# Patient Record
Sex: Female | Born: 1955 | Hispanic: Yes | Marital: Married | Smoking: Never smoker
Health system: Southern US, Community
[De-identification: ages and names within clinical notes are randomized; demographics above are authoritative.]

## PROBLEM LIST (undated history)

## (undated) DIAGNOSIS — I219 Acute myocardial infarction, unspecified: Secondary | ICD-10-CM

## (undated) DIAGNOSIS — E7439 Other disorders of intestinal carbohydrate absorption: Secondary | ICD-10-CM

## (undated) DIAGNOSIS — I10 Essential (primary) hypertension: Secondary | ICD-10-CM

---

## 2017-03-17 ENCOUNTER — Encounter (HOSPITAL_COMMUNITY): Payer: Self-pay

## 2017-03-17 ENCOUNTER — Emergency Department (HOSPITAL_COMMUNITY): Payer: Self-pay

## 2017-03-17 ENCOUNTER — Inpatient Hospital Stay (HOSPITAL_COMMUNITY)
Admission: EM | Admit: 2017-03-17 | Discharge: 2017-03-21 | DRG: 481 | Disposition: A | Discharge status: Home / Self Care | Payer: Self-pay | Attending: Internal Medicine | Admitting: Internal Medicine

## 2017-03-17 DIAGNOSIS — D72829 Elevated white blood cell count, unspecified: Secondary | ICD-10-CM | POA: Diagnosis present

## 2017-03-17 DIAGNOSIS — D696 Thrombocytopenia, unspecified: Secondary | ICD-10-CM | POA: Diagnosis present

## 2017-03-17 DIAGNOSIS — W109XXA Fall (on) (from) unspecified stairs and steps, initial encounter: Secondary | ICD-10-CM | POA: Diagnosis present

## 2017-03-17 DIAGNOSIS — D62 Acute posthemorrhagic anemia: Secondary | ICD-10-CM | POA: Diagnosis not present

## 2017-03-17 DIAGNOSIS — E7439 Other disorders of intestinal carbohydrate absorption: Secondary | ICD-10-CM | POA: Diagnosis present

## 2017-03-17 DIAGNOSIS — Z9889 Other specified postprocedural states: Secondary | ICD-10-CM

## 2017-03-17 DIAGNOSIS — F329 Major depressive disorder, single episode, unspecified: Secondary | ICD-10-CM | POA: Diagnosis present

## 2017-03-17 DIAGNOSIS — I2510 Atherosclerotic heart disease of native coronary artery without angina pectoris: Secondary | ICD-10-CM | POA: Diagnosis present

## 2017-03-17 DIAGNOSIS — S72492A Other fracture of lower end of left femur, initial encounter for closed fracture: Secondary | ICD-10-CM

## 2017-03-17 DIAGNOSIS — I10 Essential (primary) hypertension: Secondary | ICD-10-CM | POA: Diagnosis present

## 2017-03-17 DIAGNOSIS — I252 Old myocardial infarction: Secondary | ICD-10-CM

## 2017-03-17 DIAGNOSIS — S72332A Displaced oblique fracture of shaft of left femur, initial encounter for closed fracture: Principal | ICD-10-CM | POA: Diagnosis present

## 2017-03-17 DIAGNOSIS — R739 Hyperglycemia, unspecified: Secondary | ICD-10-CM | POA: Diagnosis present

## 2017-03-17 DIAGNOSIS — Z419 Encounter for procedure for purposes other than remedying health state, unspecified: Secondary | ICD-10-CM

## 2017-03-17 DIAGNOSIS — R52 Pain, unspecified: Secondary | ICD-10-CM

## 2017-03-17 HISTORY — DX: Other disorders of intestinal carbohydrate absorption: E74.39

## 2017-03-17 HISTORY — DX: Acute myocardial infarction, unspecified: I21.9

## 2017-03-17 HISTORY — DX: Essential (primary) hypertension: I10

## 2017-03-17 MED ORDER — OXYCODONE-ACETAMINOPHEN 5-325 MG PO TABS
2.0000 | ORAL_TABLET | Freq: Once | ORAL | Status: AC
  Administered 2017-03-18: 2 via ORAL
  Filled 2017-03-17: qty 2

## 2017-03-17 NOTE — ED Triage Notes (Signed)
Pt comes via [facility] for fall, slipped down appx 3-4 stairs, L knee got caught under her, pedal pulses weak. No LOC. PTA received 150 mcg of fentanyl

## 2017-03-17 NOTE — ED Provider Notes (Signed)
[facility]-EMERGENCY DEPT Provider Note   CSN: [id number] Arrival date & time: 03/17/17  2258     History   Chief Complaint Chief Complaint  Patient presents with  . Fall    HPI Michele Petersen is a 61 y.o. female.  Patient presents via EMS after a fall down 3 or 4 stairs. History through translator at bedside. Patient states she lost her balance and slipped and fell down about 3 stairs with her left lower leg being pinned underneath her. Did not hit head or lose consciousness. Denies head, neck or back pain. Denies chest pain or abdominal pain. Complains of pain to left knee and left lower leg. No focal weakness, numbness or tingling. No bowel or bladder incontinence. She does not take any blood thinners. Denies any other injury. Remembers whole fall. Denies any preceding dizziness or lightheadedness.   The history is provided by the patient and the EMS personnel. The history is limited by a language barrier.  Fall  Pertinent negatives include no chest pain, no abdominal pain, no headaches and no shortness of breath.    Past Medical History:  Diagnosis Date  . Hypertension     There are no active problems to display for this patient.   Past Surgical History:  Procedure Laterality Date  . CESAREAN SECTION      OB History    No data available       Home Medications    Prior to Admission medications   Not on File    Family History No family history on file.  Social History Social History  Substance Use Topics  . Smoking status: Never Smoker  . Smokeless tobacco: Never Used  . Alcohol use No     Allergies   Patient has no known allergies.   Review of Systems Review of Systems  Constitutional: Negative for activity change, appetite change and fever.  HENT: Negative for congestion, nosebleeds and postnasal drip.   Respiratory: Negative for cough, chest tightness and shortness of breath.   Cardiovascular: Negative for chest pain.  Gastrointestinal: Negative  for abdominal pain, nausea and vomiting.  Genitourinary: Negative for dysuria and hematuria.  Musculoskeletal: Positive for arthralgias and myalgias. Negative for back pain and neck pain.  Skin: Negative for rash.  Neurological: Negative for dizziness, syncope, light-headedness and headaches.    all other systems are negative except as noted in the HPI and PMH.    Physical Exam Updated Vital Signs BP (!) 194/92 (BP Location: Left Arm)   Pulse 65   Resp 20   Ht  (1.626 m)   Wt 98 kg (216 lb)   SpO2 98%   BMI 37.08 kg/m   Physical Exam  Constitutional: She is oriented to person, place, and time. She appears well-developed and well-nourished. No distress.  HENT:  Head: Normocephalic and atraumatic.  Mouth/Throat: Oropharynx is clear and moist. No oropharyngeal exudate.  Eyes: Pupils are equal, round, and reactive to light. Conjunctivae and EOM are normal.  Neck: Normal range of motion. Neck supple.  No C spine tenderness  Cardiovascular: Normal rate, regular rhythm, normal heart sounds and intact distal pulses.   No murmur heard. Pulmonary/Chest: Effort normal and breath sounds normal. No respiratory distress.  Abdominal: Soft. There is no tenderness. There is no rebound and no guarding.  Musculoskeletal: Normal range of motion. She exhibits edema, tenderness and deformity.  Mild diffuse swelling of L knee, distal femur and proximal lower leg. Reduced ROM due to pain.  Intact DP and  PT pulses. Ankle flexion and extension intact. Compartments soft.   Neurological: She is alert and oriented to person, place, and time. No cranial nerve deficit. She exhibits normal muscle tone. Coordination normal.   5/5 strength throughout. CN 2-12 intact.Equal grip strength.   Skin: Skin is warm.  Psychiatric: She has a normal mood and affect. Her behavior is normal.  Nursing note and vitals reviewed.    ED Treatments / Results  Labs (all labs ordered are listed, but only abnormal results  are displayed) Labs Reviewed  CBC WITH DIFFERENTIAL/PLATELET - Abnormal; Notable for the following:       Result Value   Hemoglobin 11.9 (*)    Platelets 136 (*)    All other components within normal limits  BASIC METABOLIC PANEL - Abnormal; Notable for the following:    Glucose, Bld 148 (*)    Creatinine, Ser 1.11 (*)    Calcium 8.8 (*)    GFR calc non Af Amer 53 (*)    All other components within normal limits  HEPATIC FUNCTION PANEL - Abnormal; Notable for the following:    Total Protein 6.4 (*)    All other components within normal limits  SURGICAL PCR SCREEN  PROTIME-INR  APTT  HEMOGLOBIN A1C  HIV ANTIBODY (ROUTINE TESTING)    EKG  EKG Interpretation None       Radiology Dg Knee 1-2 Views Left  Result Date: 03/18/2017 CLINICAL DATA:  61 y/o  F; status post fall. EXAM: LEFT FEMUR 2 VIEWS; DG HIP (WITH OR WITHOUT PELVIS) 2-3V LEFT; LEFT KNEE - 1-2 VIEW; LEFT TIBIA AND FIBULA - 2 VIEW COMPARISON:  None. FINDINGS: Left hip: There is no evidence of fracture or other focal bone lesions. Soft tissues are unremarkable. Left femur: Acute comminuted oblique fracture of the lower femoral diaphysis with 3 major fracture components. There is 1 shaft's medial displacement of the proximal femur relative to the distal femur. Left knee: Comminuted lower femur fracture as described above. The knee joint is well aligned with mild medial femorotibial compartment joint space narrowing and small periarticular osteophytes. Left tibia and fibula: There is no evidence of fracture or other focal bone lesions. Soft tissues are unremarkable. Large plantar calcaneal enthesophyte. IMPRESSION: Acute comminuted oblique fracture of the lower femoral diaphysis with 3 major fracture components. One shaft's with medial displacement of proximal femur relative to distal femur. Hip, knee, and ankle joints are maintained. Electronically Signed   By: Mitzi Hansen M.D.   On: 03/18/2017 00:27   Dg  Tibia/fibula Left  Result Date: 03/18/2017 CLINICAL DATA:  61 y/o  F; status post fall. EXAM: LEFT FEMUR 2 VIEWS; DG HIP (WITH OR WITHOUT PELVIS) 2-3V LEFT; LEFT KNEE - 1-2 VIEW; LEFT TIBIA AND FIBULA - 2 VIEW COMPARISON:  None. FINDINGS: Left hip: There is no evidence of fracture or other focal bone lesions. Soft tissues are unremarkable. Left femur: Acute comminuted oblique fracture of the lower femoral diaphysis with 3 major fracture components. There is 1 shaft's medial displacement of the proximal femur relative to the distal femur. Left knee: Comminuted lower femur fracture as described above. The knee joint is well aligned with mild medial femorotibial compartment joint space narrowing and small periarticular osteophytes. Left tibia and fibula: There is no evidence of fracture or other focal bone lesions. Soft tissues are unremarkable. Large plantar calcaneal enthesophyte. IMPRESSION: Acute comminuted oblique fracture of the lower femoral diaphysis with 3 major fracture components. One shaft's with medial displacement of proximal femur relative  to distal femur. Hip, knee, and ankle joints are maintained. Electronically Signed   By: Mitzi Hansen M.D.   On: 03/18/2017 00:27   Ct Knee Left Wo Contrast  Result Date: 03/18/2017 CLINICAL DATA:  Left femoral fracture after slip and fall down 3-4 stairs. EXAM: CT OF THE LEFT KNEE WITHOUT CONTRAST TECHNIQUE: Multidetector CT imaging of the knee was performed according to the standard protocol. Multiplanar CT image reconstructions were also generated. COMPARISON:  Left femur and knee radiographs FINDINGS: Bones/Joint/Cartilage An acute, partly included closed, comminuted spiral oblique fracture of the distal left femoral diaphysis is identified with 1 shaft width anterior and lateral displacement of the main distal fracture fragment which includes the femoral condyles. The more cephalad margin of a fracture is not entirely included on this study. No  intra-articular extension. Trace suprapatellar joint effusion on the axial images. The anterior margin of the fracture terminates just above the metadiaphysis. No knee joint dislocation. There is osteoarthritic joint space narrowing of the femorotibial compartment with spurring. Ligaments Suboptimally assessed by CT. Muscles and Tendons No intramuscular hemorrhage. Soft tissues The femoral and popliteal arteries appear to be spared osseous involvement. Mild soft tissue induration about the comminuted fractures noted however. No focal soft tissue hematoma. IMPRESSION: 1. An acute, closed, comminuted spiral oblique fracture of the distal left femoral diaphysis is identified with 1 shaft width anterior and lateral displacement of the main distal fracture fragment which includes the femoral condyles. 2. No intra-articular extension of the fracture. Trace joint effusion. Electronically Signed   By: Tollie Eth M.D.   On: 03/18/2017 02:51   Dg Hip Unilat W Or Wo Pelvis 2-3 Views Left  Result Date: 03/18/2017 CLINICAL DATA:  61 y/o  F; status post fall. EXAM: LEFT FEMUR 2 VIEWS; DG HIP (WITH OR WITHOUT PELVIS) 2-3V LEFT; LEFT KNEE - 1-2 VIEW; LEFT TIBIA AND FIBULA - 2 VIEW COMPARISON:  None. FINDINGS: Left hip: There is no evidence of fracture or other focal bone lesions. Soft tissues are unremarkable. Left femur: Acute comminuted oblique fracture of the lower femoral diaphysis with 3 major fracture components. There is 1 shaft's medial displacement of the proximal femur relative to the distal femur. Left knee: Comminuted lower femur fracture as described above. The knee joint is well aligned with mild medial femorotibial compartment joint space narrowing and small periarticular osteophytes. Left tibia and fibula: There is no evidence of fracture or other focal bone lesions. Soft tissues are unremarkable. Large plantar calcaneal enthesophyte. IMPRESSION: Acute comminuted oblique fracture of the lower femoral diaphysis  with 3 major fracture components. One shaft's with medial displacement of proximal femur relative to distal femur. Hip, knee, and ankle joints are maintained. Electronically Signed   By: Mitzi Hansen M.D.   On: 03/18/2017 00:27   Dg Femur Min 2 Views Left  Result Date: 03/18/2017 CLINICAL DATA:  62 y/o  F; status post fall. EXAM: LEFT FEMUR 2 VIEWS; DG HIP (WITH OR WITHOUT PELVIS) 2-3V LEFT; LEFT KNEE - 1-2 VIEW; LEFT TIBIA AND FIBULA - 2 VIEW COMPARISON:  None. FINDINGS: Left hip: There is no evidence of fracture or other focal bone lesions. Soft tissues are unremarkable. Left femur: Acute comminuted oblique fracture of the lower femoral diaphysis with 3 major fracture components. There is 1 shaft's medial displacement of the proximal femur relative to the distal femur. Left knee: Comminuted lower femur fracture as described above. The knee joint is well aligned with mild medial femorotibial compartment joint space narrowing and small  periarticular osteophytes. Left tibia and fibula: There is no evidence of fracture or other focal bone lesions. Soft tissues are unremarkable. Large plantar calcaneal enthesophyte. IMPRESSION: Acute comminuted oblique fracture of the lower femoral diaphysis with 3 major fracture components. One shaft's with medial displacement of proximal femur relative to distal femur. Hip, knee, and ankle joints are maintained. Electronically Signed   By: Mitzi Hansen M.D.   On: 03/18/2017 00:27    Procedures Procedures (including critical care time)  Medications Ordered in ED Medications - No data to display   Initial Impression / Assessment and Plan / ED Course  I have reviewed the triage vital signs and the nursing notes.  Pertinent labs & imaging results that were available during my care of the patient were reviewed by me and considered in my medical decision making (see chart for details).    Mechanical fall with L knee and lower leg pain.  No LOC.  No neck or back pain. Neurovascularly intact.   Imaging remarkable for distal femur fracture on the left. Neurovascularly intact.  Discussed with Dr. Roda Shutters who will see patient in the morning and recommends admission to hospitalist service  Patient placed in knee immobilizer. CT scan obtained at request of orthopedics.  Medical admission discussed with Dr. Selena Batten. Final Clinical Impressions(s) / ED Diagnoses   Final diagnoses:  Closed comminuted intra-articular fracture of distal femur, left, initial encounter [facility])    New Prescriptions New Prescriptions   No medications on file     Glynn Octave, MD 03/18/17 [phone]

## 2017-03-18 ENCOUNTER — Inpatient Hospital Stay (HOSPITAL_COMMUNITY): Payer: Self-pay

## 2017-03-18 ENCOUNTER — Inpatient Hospital Stay (HOSPITAL_COMMUNITY): Payer: Self-pay | Admitting: Anesthesiology

## 2017-03-18 ENCOUNTER — Encounter (HOSPITAL_COMMUNITY): Payer: Self-pay | Admitting: Internal Medicine

## 2017-03-18 ENCOUNTER — Encounter (HOSPITAL_COMMUNITY)
Admission: EM | Disposition: A | Discharge status: Home / Self Care | Payer: Self-pay | Source: Home / Self Care | Attending: Internal Medicine

## 2017-03-18 DIAGNOSIS — S72332A Displaced oblique fracture of shaft of left femur, initial encounter for closed fracture: Secondary | ICD-10-CM | POA: Diagnosis present

## 2017-03-18 DIAGNOSIS — I2510 Atherosclerotic heart disease of native coronary artery without angina pectoris: Secondary | ICD-10-CM

## 2017-03-18 DIAGNOSIS — S72492A Other fracture of lower end of left femur, initial encounter for closed fracture: Secondary | ICD-10-CM

## 2017-03-18 DIAGNOSIS — E7439 Other disorders of intestinal carbohydrate absorption: Secondary | ICD-10-CM

## 2017-03-18 DIAGNOSIS — S72402A Unspecified fracture of lower end of left femur, initial encounter for closed fracture: Secondary | ICD-10-CM

## 2017-03-18 HISTORY — PX: FEMUR IM NAIL: SHX1597

## 2017-03-18 LAB — CBC WITH DIFFERENTIAL/PLATELET
BASOS PCT: 0 %
Basophils Absolute: 0 10*3/uL (ref 0.0–0.1)
EOS PCT: 1 %
Eosinophils Absolute: 0.1 10*3/uL (ref 0.0–0.7)
HEMATOCRIT: 37.1 % (ref 36.0–46.0)
Hemoglobin: 11.9 g/dL — ABNORMAL LOW (ref 12.0–15.0)
LYMPHS PCT: 13 %
Lymphs Abs: 1.1 10*3/uL (ref 0.7–4.0)
MCH: 27.7 pg (ref 26.0–34.0)
MCHC: 32.1 g/dL (ref 30.0–36.0)
MCV: 86.5 fL (ref 78.0–100.0)
MONO ABS: 0.3 10*3/uL (ref 0.1–1.0)
MONOS PCT: 4 %
NEUTROS ABS: 7.2 10*3/uL (ref 1.7–7.7)
Neutrophils Relative %: 82 %
Platelets: 136 10*3/uL — ABNORMAL LOW (ref 150–400)
RBC: 4.29 MIL/uL (ref 3.87–5.11)
RDW: 13.6 % (ref 11.5–15.5)
WBC: 8.8 10*3/uL (ref 4.0–10.5)

## 2017-03-18 LAB — BASIC METABOLIC PANEL
Anion gap: 7 (ref 5–15)
BUN: 17 mg/dL (ref 6–20)
CHLORIDE: 107 mmol/L (ref 101–111)
CO2: 24 mmol/L (ref 22–32)
CREATININE: 1.11 mg/dL — AB (ref 0.44–1.00)
Calcium: 8.8 mg/dL — ABNORMAL LOW (ref 8.9–10.3)
GFR calc Af Amer: >60 mL/min (ref >60)
GFR calc non Af Amer: 53 mL/min — ABNORMAL LOW (ref >60)
GLUCOSE: 148 mg/dL — AB (ref 65–99)
Potassium: 3.8 mmol/L (ref 3.5–5.1)
Sodium: 138 mmol/L (ref 135–145)

## 2017-03-18 LAB — HEPATIC FUNCTION PANEL
ALBUMIN: 3.8 g/dL (ref 3.5–5.0)
ALK PHOS: 67 U/L (ref 38–126)
ALT: 17 U/L (ref 14–54)
AST: 18 U/L (ref 15–41)
BILIRUBIN TOTAL: 0.6 mg/dL (ref 0.3–1.2)
Bilirubin, Direct: 0.1 mg/dL (ref 0.1–0.5)
Indirect Bilirubin: 0.5 mg/dL (ref 0.3–0.9)
Total Protein: 6.4 g/dL — ABNORMAL LOW (ref 6.5–8.1)

## 2017-03-18 LAB — APTT: aPTT: 24 seconds (ref 24–36)

## 2017-03-18 LAB — HEMOGLOBIN A1C
Hgb A1c MFr Bld: 5.6 % (ref 4.8–5.6)
Mean Plasma Glucose: 114.02 mg/dL

## 2017-03-18 LAB — PROTIME-INR
INR: 1
PROTHROMBIN TIME: 13.1 s (ref 11.4–15.2)

## 2017-03-18 LAB — SURGICAL PCR SCREEN
MRSA, PCR: NEGATIVE
STAPHYLOCOCCUS AUREUS: NEGATIVE

## 2017-03-18 LAB — HIV ANTIBODY (ROUTINE TESTING W REFLEX): HIV SCREEN 4TH GENERATION: NONREACTIVE

## 2017-03-18 SURGERY — INSERTION, INTRAMEDULLARY ROD, FEMUR, RETROGRADE
Anesthesia: General | Site: Leg Upper | Laterality: Left

## 2017-03-18 MED ORDER — OXYCODONE HCL 5 MG PO TABS
5.0000 mg | ORAL_TABLET | ORAL | Status: DC | PRN
  Administered 2017-03-21: 10 mg via ORAL
  Filled 2017-03-18: qty 2

## 2017-03-18 MED ORDER — ACETAMINOPHEN 325 MG PO TABS
650.0000 mg | ORAL_TABLET | Freq: Four times a day (QID) | ORAL | Status: DC | PRN
  Administered 2017-03-20 – 2017-03-21 (×3): 650 mg via ORAL
  Filled 2017-03-18 (×3): qty 2

## 2017-03-18 MED ORDER — ACETAMINOPHEN 650 MG RE SUPP: 650.0000 mg | Freq: Four times a day (QID) | RECTAL | Status: DC | PRN

## 2017-03-18 MED ORDER — ENOXAPARIN SODIUM 40 MG/0.4ML ~~LOC~~ SOLN: 40.0000 mg | Freq: Every day | SUBCUTANEOUS | 0 refills | Status: AC

## 2017-03-18 MED ORDER — FENTANYL CITRATE (PF) 100 MCG/2ML IJ SOLN: 25.0000 ug | INTRAMUSCULAR | Status: DC | PRN

## 2017-03-18 MED ORDER — MENTHOL 3 MG MT LOZG: 1.0000 | LOZENGE | OROMUCOSAL | Status: DC | PRN

## 2017-03-18 MED ORDER — OXYCODONE HCL 5 MG PO TABS: 5.0000 mg | ORAL_TABLET | ORAL | 0 refills | Status: AC | PRN

## 2017-03-18 MED ORDER — MORPHINE SULFATE (PF) 4 MG/ML IV SOLN
0.5000 mg | INTRAVENOUS | Status: DC | PRN
  Administered 2017-03-18 (×2): 0.52 mg via INTRAVENOUS
  Filled 2017-03-18 (×2): qty 1

## 2017-03-18 MED ORDER — METHOCARBAMOL 500 MG PO TABS
500.0000 mg | ORAL_TABLET | Freq: Four times a day (QID) | ORAL | Status: DC | PRN
  Administered 2017-03-19 – 2017-03-21 (×2): 500 mg via ORAL
  Filled 2017-03-18 (×2): qty 1

## 2017-03-18 MED ORDER — CITALOPRAM HYDROBROMIDE 20 MG PO TABS
20.0000 mg | ORAL_TABLET | Freq: Every day | ORAL | Status: DC
  Administered 2017-03-19 – 2017-03-21 (×3): 20 mg via ORAL
  Filled 2017-03-18 (×3): qty 1

## 2017-03-18 MED ORDER — FENTANYL CITRATE (PF) 250 MCG/5ML IJ SOLN
INTRAMUSCULAR | Status: DC | PRN
  Administered 2017-03-18: 50 ug via INTRAVENOUS
  Administered 2017-03-18: 100 ug via INTRAVENOUS
  Administered 2017-03-18: 50 ug via INTRAVENOUS
  Administered 2017-03-18: 100 ug via INTRAVENOUS

## 2017-03-18 MED ORDER — ONDANSETRON HCL 4 MG/2ML IJ SOLN
INTRAMUSCULAR | Status: DC | PRN
  Administered 2017-03-18: 4 mg via INTRAVENOUS

## 2017-03-18 MED ORDER — EPHEDRINE SULFATE 50 MG/ML IJ SOLN
INTRAMUSCULAR | Status: DC | PRN
  Administered 2017-03-18: 10 mg via INTRAVENOUS

## 2017-03-18 MED ORDER — ACETAMINOPHEN 650 MG RE SUPP
650.0000 mg | Freq: Four times a day (QID) | RECTAL | Status: DC | PRN
Start: 2017-03-18 — End: 2017-03-18

## 2017-03-18 MED ORDER — MIDAZOLAM HCL 2 MG/2ML IJ SOLN
INTRAMUSCULAR | Status: AC
  Filled 2017-03-18: qty 2

## 2017-03-18 MED ORDER — METOPROLOL SUCCINATE ER 25 MG PO TB24
25.0000 mg | ORAL_TABLET | Freq: Every day | ORAL | Status: DC
  Administered 2017-03-18 – 2017-03-21 (×4): 25 mg via ORAL
  Filled 2017-03-18 (×4): qty 1

## 2017-03-18 MED ORDER — HYDROCODONE-ACETAMINOPHEN 5-325 MG PO TABS
1.0000 | ORAL_TABLET | Freq: Four times a day (QID) | ORAL | Status: DC | PRN
  Administered 2017-03-19 – 2017-03-20 (×4): 1 via ORAL
  Administered 2017-03-21: 2 via ORAL
  Filled 2017-03-18 (×3): qty 1
  Filled 2017-03-18 (×2): qty 2

## 2017-03-18 MED ORDER — ONDANSETRON HCL 4 MG/2ML IJ SOLN: 4.0000 mg | Freq: Four times a day (QID) | INTRAMUSCULAR | Status: DC | PRN

## 2017-03-18 MED ORDER — CEFAZOLIN SODIUM-DEXTROSE 2-4 GM/100ML-% IV SOLN
2.0000 g | Freq: Three times a day (TID) | INTRAVENOUS | Status: AC
  Administered 2017-03-18 – 2017-03-19 (×3): 2 g via INTRAVENOUS
  Filled 2017-03-18 (×3): qty 100

## 2017-03-18 MED ORDER — LIDOCAINE HCL (CARDIAC) 20 MG/ML IV SOLN
INTRAVENOUS | Status: DC | PRN
  Administered 2017-03-18: 100 mg via INTRATRACHEAL

## 2017-03-18 MED ORDER — PHENOL 1.4 % MT LIQD: 1.0000 | OROMUCOSAL | Status: DC | PRN

## 2017-03-18 MED ORDER — DEXAMETHASONE SODIUM PHOSPHATE 10 MG/ML IJ SOLN
INTRAMUSCULAR | Status: DC | PRN
  Administered 2017-03-18: 10 mg via INTRAVENOUS

## 2017-03-18 MED ORDER — ENOXAPARIN SODIUM 40 MG/0.4ML ~~LOC~~ SOLN
40.0000 mg | SUBCUTANEOUS | Status: DC
  Administered 2017-03-19 – 2017-03-20 (×2): 40 mg via SUBCUTANEOUS
  Filled 2017-03-18 (×2): qty 0.4

## 2017-03-18 MED ORDER — SUGAMMADEX SODIUM 200 MG/2ML IV SOLN
INTRAVENOUS | Status: DC | PRN
  Administered 2017-03-18: 200 mg via INTRAVENOUS

## 2017-03-18 MED ORDER — MIDAZOLAM HCL 2 MG/2ML IJ SOLN
INTRAMUSCULAR | Status: DC | PRN
  Administered 2017-03-18: 2 mg via INTRAVENOUS

## 2017-03-18 MED ORDER — MORPHINE SULFATE (PF) 4 MG/ML IV SOLN
1.0000 mg | INTRAVENOUS | Status: DC | PRN
  Administered 2017-03-18: 1 mg via INTRAVENOUS
  Filled 2017-03-18: qty 1

## 2017-03-18 MED ORDER — LACTATED RINGERS IV SOLN
INTRAVENOUS | Status: DC | PRN
  Administered 2017-03-18: 08:00:00 via INTRAVENOUS

## 2017-03-18 MED ORDER — ACETAMINOPHEN 325 MG PO TABS: 650.0000 mg | ORAL_TABLET | Freq: Four times a day (QID) | ORAL | Status: DC | PRN

## 2017-03-18 MED ORDER — ROCURONIUM BROMIDE 100 MG/10ML IV SOLN
INTRAVENOUS | Status: DC | PRN
  Administered 2017-03-18: 30 mg via INTRAVENOUS
  Administered 2017-03-18: 20 mg via INTRAVENOUS

## 2017-03-18 MED ORDER — METOCLOPRAMIDE HCL 5 MG PO TABS: 5.0000 mg | ORAL_TABLET | Freq: Three times a day (TID) | ORAL | Status: DC | PRN

## 2017-03-18 MED ORDER — METOCLOPRAMIDE HCL 5 MG/ML IJ SOLN: 5.0000 mg | Freq: Three times a day (TID) | INTRAMUSCULAR | Status: DC | PRN

## 2017-03-18 MED ORDER — PROMETHAZINE HCL 25 MG/ML IJ SOLN: 6.2500 mg | INTRAMUSCULAR | Status: DC | PRN

## 2017-03-18 MED ORDER — DIPHENHYDRAMINE HCL 50 MG/ML IJ SOLN: 25.0000 mg | Freq: Four times a day (QID) | INTRAMUSCULAR | Status: DC | PRN

## 2017-03-18 MED ORDER — SUCCINYLCHOLINE CHLORIDE 20 MG/ML IJ SOLN
INTRAMUSCULAR | Status: DC | PRN
  Administered 2017-03-18: 100 mg via INTRAVENOUS

## 2017-03-18 MED ORDER — FENTANYL CITRATE (PF) 250 MCG/5ML IJ SOLN
INTRAMUSCULAR | Status: AC
  Filled 2017-03-18: qty 5

## 2017-03-18 MED ORDER — METHOCARBAMOL 1000 MG/10ML IJ SOLN
500.0000 mg | Freq: Four times a day (QID) | INTRAVENOUS | Status: DC | PRN
  Filled 2017-03-18: qty 5

## 2017-03-18 MED ORDER — PROPOFOL 10 MG/ML IV BOLUS
INTRAVENOUS | Status: DC | PRN
  Administered 2017-03-18: 100 mg via INTRAVENOUS

## 2017-03-18 MED ORDER — SODIUM CHLORIDE 0.9 % IV SOLN
INTRAVENOUS | Status: AC
  Administered 2017-03-18: 03:00:00 via INTRAVENOUS

## 2017-03-18 MED ORDER — 0.9 % SODIUM CHLORIDE (POUR BTL) OPTIME
TOPICAL | Status: DC | PRN
  Administered 2017-03-18: 1000 mL

## 2017-03-18 MED ORDER — ASPIRIN EC 81 MG PO TBEC
81.0000 mg | DELAYED_RELEASE_TABLET | Freq: Every day | ORAL | Status: DC
  Administered 2017-03-19 – 2017-03-21 (×3): 81 mg via ORAL
  Filled 2017-03-18 (×3): qty 1

## 2017-03-18 MED ORDER — PROPOFOL 10 MG/ML IV BOLUS
INTRAVENOUS | Status: AC
  Filled 2017-03-18: qty 20

## 2017-03-18 MED ORDER — SODIUM CHLORIDE 0.9 % IV SOLN
INTRAVENOUS | Status: DC
  Administered 2017-03-18: 12:00:00 via INTRAVENOUS

## 2017-03-18 MED ORDER — ALUM & MAG HYDROXIDE-SIMETH 200-200-20 MG/5ML PO SUSP: 30.0000 mL | ORAL | Status: DC | PRN

## 2017-03-18 MED ORDER — LABETALOL HCL 5 MG/ML IV SOLN
INTRAVENOUS | Status: DC | PRN
  Administered 2017-03-18 (×2): 5 mg via INTRAVENOUS

## 2017-03-18 MED ORDER — ONDANSETRON HCL 4 MG PO TABS: 4.0000 mg | ORAL_TABLET | Freq: Four times a day (QID) | ORAL | Status: DC | PRN

## 2017-03-18 MED ORDER — ENOXAPARIN SODIUM 40 MG/0.4ML ~~LOC~~ SOLN: 40.0000 mg | Freq: Every day | SUBCUTANEOUS | Status: DC

## 2017-03-18 MED ORDER — ATORVASTATIN CALCIUM 20 MG PO TABS
20.0000 mg | ORAL_TABLET | Freq: Every day | ORAL | Status: DC
  Administered 2017-03-18 – 2017-03-21 (×4): 20 mg via ORAL
  Filled 2017-03-18 (×4): qty 1

## 2017-03-18 MED ORDER — LOSARTAN POTASSIUM 25 MG PO TABS
25.0000 mg | ORAL_TABLET | Freq: Every day | ORAL | Status: DC
  Administered 2017-03-18 – 2017-03-21 (×4): 25 mg via ORAL
  Filled 2017-03-18 (×4): qty 1

## 2017-03-18 SURGICAL SUPPLY — 61 items
ALCOHOL 70% 16 OZ (MISCELLANEOUS) ×3 IMPLANT
BANDAGE ACE 6X5 VEL STRL LF (GAUZE/BANDAGES/DRESSINGS) IMPLANT
BIT DRILL CALIBRATED 4.3MMX365 (DRILL) ×1 IMPLANT
BIT DRILL CROWE PNT TWST 4.5MM (DRILL) ×2 IMPLANT
BLADE CLIPPER SURG (BLADE) IMPLANT
BLADE SURG 15 STRL LF DISP TIS (BLADE) ×1 IMPLANT
BLADE SURG 15 STRL SS (BLADE) ×2
BNDG ELASTIC 6X15 VLCR STRL LF (GAUZE/BANDAGES/DRESSINGS) ×3 IMPLANT
BNDG GAUZE ELAST 4 BULKY (GAUZE/BANDAGES/DRESSINGS) IMPLANT
COVER SURGICAL LIGHT HANDLE (MISCELLANEOUS) ×3 IMPLANT
CUFF TOURNIQUET SINGLE 34IN LL (TOURNIQUET CUFF) IMPLANT
CUFF TOURNIQUET SINGLE 44IN (TOURNIQUET CUFF) IMPLANT
DRAPE C-ARM 42X72 X-RAY (DRAPES) ×3 IMPLANT
DRAPE HALF SHEET 40X57 (DRAPES) ×6 IMPLANT
DRAPE IMP U-DRAPE 54X76 (DRAPES) ×3 IMPLANT
DRAPE ORTHO SPLIT 77X108 STRL (DRAPES) ×4
DRAPE SURG ORHT 6 SPLT 77X108 (DRAPES) ×2 IMPLANT
DRILL CALIBRATED 4.3MMX365 (DRILL) ×3
DRILL CROWE POINT TWIST 4.5MM (DRILL) ×6
DRSG ADAPTIC 3X8 NADH LF (GAUZE/BANDAGES/DRESSINGS) ×3 IMPLANT
DRSG MEPILEX BORDER 4X4 (GAUZE/BANDAGES/DRESSINGS) ×3 IMPLANT
DRSG MEPILEX BORDER 4X8 (GAUZE/BANDAGES/DRESSINGS) ×3 IMPLANT
DRSG MEPITEL 4X7.2 (GAUZE/BANDAGES/DRESSINGS) ×3 IMPLANT
DURAPREP 26ML APPLICATOR (WOUND CARE) ×6 IMPLANT
ELECT REM PT RETURN 9FT ADLT (ELECTROSURGICAL) ×3
ELECTRODE REM PT RTRN 9FT ADLT (ELECTROSURGICAL) ×1 IMPLANT
GAUZE SPONGE 4X4 12PLY STRL (GAUZE/BANDAGES/DRESSINGS) ×6 IMPLANT
GAUZE SPONGE 4X4 16PLY XRAY LF (GAUZE/BANDAGES/DRESSINGS) ×3 IMPLANT
GLOVE SKINSENSE NS SZ7.5 (GLOVE) ×2
GLOVE SKINSENSE STRL SZ7.5 (GLOVE) ×1 IMPLANT
GLOVE SURG SYN 7.5  E (GLOVE) ×2
GLOVE SURG SYN 7.5 E (GLOVE) ×1 IMPLANT
GOWN SRG XL XLNG 56XLVL 4 (GOWN DISPOSABLE) ×1 IMPLANT
GOWN STRL NON-REIN XL XLG LVL4 (GOWN DISPOSABLE) ×2
GOWN STRL REUS W/ TWL LRG LVL3 (GOWN DISPOSABLE) ×2 IMPLANT
GOWN STRL REUS W/TWL LRG LVL3 (GOWN DISPOSABLE) ×4
GUIDEWIRE BEAD TIP (WIRE) ×3 IMPLANT
KIT BASIN OR (CUSTOM PROCEDURE TRAY) ×3 IMPLANT
KIT ROOM TURNOVER OR (KITS) ×3 IMPLANT
MANIFOLD NEPTUNE II (INSTRUMENTS) ×3 IMPLANT
NAIL FEM RETRO 12X360 (Nail) ×3 IMPLANT
NEEDLE 22X1 1/2 (OR ONLY) (NEEDLE) ×3 IMPLANT
NS IRRIG 1000ML POUR BTL (IV SOLUTION) ×3 IMPLANT
PACK GENERAL/GYN (CUSTOM PROCEDURE TRAY) ×3 IMPLANT
PACK UNIVERSAL I (CUSTOM PROCEDURE TRAY) ×3 IMPLANT
PAD ARMBOARD 7.5X6 YLW CONV (MISCELLANEOUS) ×6 IMPLANT
PADDING CAST COTTON 6X4 STRL (CAST SUPPLIES) ×6 IMPLANT
SCREW CORT TI DBL LEAD 5X32 (Screw) ×3 IMPLANT
SCREW CORT TI DBL LEAD 5X36 (Screw) ×3 IMPLANT
SCREW CORT TI DBL LEAD 5X58 (Screw) ×3 IMPLANT
SCREW CORT TI DBL LEAD 5X65 (Screw) ×3 IMPLANT
SCREW CORT TI DBL LEAD 5X75 (Screw) ×3 IMPLANT
STAPLER VISISTAT 35W (STAPLE) IMPLANT
STOCKINETTE 6  STRL (DRAPES) ×2
STOCKINETTE 6 STRL (DRAPES) ×1 IMPLANT
SUT VIC AB 0 CTB1 27 (SUTURE) IMPLANT
SUT VIC AB 2-0 CTB1 (SUTURE) IMPLANT
SYR 20ML ECCENTRIC (SYRINGE) ×3 IMPLANT
TOWEL OR 17X24 6PK STRL BLUE (TOWEL DISPOSABLE) ×3 IMPLANT
TOWEL OR 17X26 10 PK STRL BLUE (TOWEL DISPOSABLE) ×3 IMPLANT
WATER STERILE IRR 1000ML POUR (IV SOLUTION) ×3 IMPLANT

## 2017-03-18 NOTE — Consult Note (Signed)
ORTHOPAEDIC CONSULTATION  REQUESTING PHYSICIAN: Kathlen Mody, MD  Chief Complaint: Left femur fracture  HPI: Michele Petersen is a 61 y.o. female who presents with left femur fracture s/p mechanical fall down some stairs PTA to ED last night.  The patient endorses severe pain in the left femur, that does not radiate, grinding in quality, worse with any movement, better with immobilization.  Denies LOC/fever/chills/nausea/vomiting.  Walks without assistive devices (walker, cane, wheelchair).  Does live independently.  Denies LOC, neck pain, abd pain.  Past Medical History:  Diagnosis Date  . Glucose intolerance   . Hypertension   . Myocardial infarction [facility])    Past Surgical History:  Procedure Laterality Date  . CESAREAN SECTION     Social History   Social History  . Marital status: Married    Spouse name: N/A  . Number of children: N/A  . Years of education: N/A   Social History Main Topics  . Smoking status: Never Smoker  . Smokeless tobacco: Never Used  . Alcohol use No  . Drug use: Unknown  . Sexual activity: Not Asked   Other Topics Concern  . None   Social History Narrative  . None   History reviewed. No pertinent family history. No Known Allergies Prior to Admission medications   Not on File   Dg Knee 1-2 Views Left  Result Date: 03/18/2017 CLINICAL DATA:  61 y/o  F; status post fall. EXAM: LEFT FEMUR 2 VIEWS; DG HIP (WITH OR WITHOUT PELVIS) 2-3V LEFT; LEFT KNEE - 1-2 VIEW; LEFT TIBIA AND FIBULA - 2 VIEW COMPARISON:  None. FINDINGS: Left hip: There is no evidence of fracture or other focal bone lesions. Soft tissues are unremarkable. Left femur: Acute comminuted oblique fracture of the lower femoral diaphysis with 3 major fracture components. There is 1 shaft's medial displacement of the proximal femur relative to the distal femur. Left knee: Comminuted lower femur fracture as described above. The knee joint is well aligned with mild medial femorotibial  compartment joint space narrowing and small periarticular osteophytes. Left tibia and fibula: There is no evidence of fracture or other focal bone lesions. Soft tissues are unremarkable. Large plantar calcaneal enthesophyte. IMPRESSION: Acute comminuted oblique fracture of the lower femoral diaphysis with 3 major fracture components. One shaft's with medial displacement of proximal femur relative to distal femur. Hip, knee, and ankle joints are maintained. Electronically Signed   By: Mitzi Hansen M.D.   On: 03/18/2017 00:27   Dg Tibia/fibula Left  Result Date: 03/18/2017 CLINICAL DATA:  61 y/o  F; status post fall. EXAM: LEFT FEMUR 2 VIEWS; DG HIP (WITH OR WITHOUT PELVIS) 2-3V LEFT; LEFT KNEE - 1-2 VIEW; LEFT TIBIA AND FIBULA - 2 VIEW COMPARISON:  None. FINDINGS: Left hip: There is no evidence of fracture or other focal bone lesions. Soft tissues are unremarkable. Left femur: Acute comminuted oblique fracture of the lower femoral diaphysis with 3 major fracture components. There is 1 shaft's medial displacement of the proximal femur relative to the distal femur. Left knee: Comminuted lower femur fracture as described above. The knee joint is well aligned with mild medial femorotibial compartment joint space narrowing and small periarticular osteophytes. Left tibia and fibula: There is no evidence of fracture or other focal bone lesions. Soft tissues are unremarkable. Large plantar calcaneal enthesophyte. IMPRESSION: Acute comminuted oblique fracture of the lower femoral diaphysis with 3 major fracture components. One shaft's with medial displacement of proximal femur relative to distal femur. Hip, knee, and ankle joints  are maintained. Electronically Signed   By: Mitzi Hansen M.D.   On: 03/18/2017 00:27   Ct Knee Left Wo Contrast  Result Date: 03/18/2017 CLINICAL DATA:  Left femoral fracture after slip and fall down 3-4 stairs. EXAM: CT OF THE LEFT KNEE WITHOUT CONTRAST TECHNIQUE:  Multidetector CT imaging of the knee was performed according to the standard protocol. Multiplanar CT image reconstructions were also generated. COMPARISON:  Left femur and knee radiographs FINDINGS: Bones/Joint/Cartilage An acute, partly included closed, comminuted spiral oblique fracture of the distal left femoral diaphysis is identified with 1 shaft width anterior and lateral displacement of the main distal fracture fragment which includes the femoral condyles. The more cephalad margin of a fracture is not entirely included on this study. No intra-articular extension. Trace suprapatellar joint effusion on the axial images. The anterior margin of the fracture terminates just above the metadiaphysis. No knee joint dislocation. There is osteoarthritic joint space narrowing of the femorotibial compartment with spurring. Ligaments Suboptimally assessed by CT. Muscles and Tendons No intramuscular hemorrhage. Soft tissues The femoral and popliteal arteries appear to be spared osseous involvement. Mild soft tissue induration about the comminuted fractures noted however. No focal soft tissue hematoma. IMPRESSION: 1. An acute, closed, comminuted spiral oblique fracture of the distal left femoral diaphysis is identified with 1 shaft width anterior and lateral displacement of the main distal fracture fragment which includes the femoral condyles. 2. No intra-articular extension of the fracture. Trace joint effusion. Electronically Signed   By: Tollie Eth M.D.   On: 03/18/2017 02:51   Dg Hip Unilat W Or Wo Pelvis 2-3 Views Left  Result Date: 03/18/2017 CLINICAL DATA:  61 y/o  F; status post fall. EXAM: LEFT FEMUR 2 VIEWS; DG HIP (WITH OR WITHOUT PELVIS) 2-3V LEFT; LEFT KNEE - 1-2 VIEW; LEFT TIBIA AND FIBULA - 2 VIEW COMPARISON:  None. FINDINGS: Left hip: There is no evidence of fracture or other focal bone lesions. Soft tissues are unremarkable. Left femur: Acute comminuted oblique fracture of the lower femoral diaphysis  with 3 major fracture components. There is 1 shaft's medial displacement of the proximal femur relative to the distal femur. Left knee: Comminuted lower femur fracture as described above. The knee joint is well aligned with mild medial femorotibial compartment joint space narrowing and small periarticular osteophytes. Left tibia and fibula: There is no evidence of fracture or other focal bone lesions. Soft tissues are unremarkable. Large plantar calcaneal enthesophyte. IMPRESSION: Acute comminuted oblique fracture of the lower femoral diaphysis with 3 major fracture components. One shaft's with medial displacement of proximal femur relative to distal femur. Hip, knee, and ankle joints are maintained. Electronically Signed   By: Mitzi Hansen M.D.   On: 03/18/2017 00:27   Dg Femur Min 2 Views Left  Result Date: 03/18/2017 CLINICAL DATA:  61 y/o  F; status post fall. EXAM: LEFT FEMUR 2 VIEWS; DG HIP (WITH OR WITHOUT PELVIS) 2-3V LEFT; LEFT KNEE - 1-2 VIEW; LEFT TIBIA AND FIBULA - 2 VIEW COMPARISON:  None. FINDINGS: Left hip: There is no evidence of fracture or other focal bone lesions. Soft tissues are unremarkable. Left femur: Acute comminuted oblique fracture of the lower femoral diaphysis with 3 major fracture components. There is 1 shaft's medial displacement of the proximal femur relative to the distal femur. Left knee: Comminuted lower femur fracture as described above. The knee joint is well aligned with mild medial femorotibial compartment joint space narrowing and small periarticular osteophytes. Left tibia and fibula: There is  no evidence of fracture or other focal bone lesions. Soft tissues are unremarkable. Large plantar calcaneal enthesophyte. IMPRESSION: Acute comminuted oblique fracture of the lower femoral diaphysis with 3 major fracture components. One shaft's with medial displacement of proximal femur relative to distal femur. Hip, knee, and ankle joints are maintained. Electronically  Signed   By: Mitzi Hansen M.D.   On: 03/18/2017 00:27    All pertinent xrays, MRI, CT independently reviewed and interpreted  Positive ROS: All other systems have been reviewed and were otherwise negative with the exception of those mentioned in the HPI and as above.  Physical Exam: General: Alert, no acute distress Cardiovascular: No pedal edema Respiratory: No cyanosis, no use of accessory musculature GI: No organomegaly, abdomen is soft and non-tender Skin: No lesions in the area of chief complaint Neurologic: Sensation intact distally Psychiatric: Patient is competent for consent with normal mood and affect Lymphatic: No axillary or cervical lymphadenopathy  MUSCULOSKELETAL:  - pain with movement of the hip and extremity - skin intact - NVI distally - compartments soft  Assessment: Left femur fracture  Plan: - surgery is recommended, patient and family are aware of r/b/a and wish to proceed - consent obtained - medical optimization per primary team - surgery is planned for today  Thank you for the consult and the opportunity to see Ms. Dettman  N. Glee Arvin, MD [facility] Orthopedics [phone] 7:11 AM

## 2017-03-18 NOTE — Progress Notes (Signed)
Just gave report to the OR nurse, Pt. Waiting for pick up for surgery.

## 2017-03-18 NOTE — Anesthesia Postprocedure Evaluation (Signed)
Anesthesia Post Note  Patient: Michele Petersen  Procedure(s) Performed: Procedure(s) (LRB): INTRAMEDULLARY (IM) RETROGRADE FEMORAL NAILING (Left)     Patient location during evaluation: PACU Anesthesia Type: General Level of consciousness: awake and alert Pain management: pain level controlled Vital Signs Assessment: post-procedure vital signs reviewed and stable Respiratory status: spontaneous breathing, nonlabored ventilation and respiratory function stable Cardiovascular status: blood pressure returned to baseline and stable Postop Assessment: no apparent nausea or vomiting Anesthetic complications: no    Last Vitals:  Vitals:   03/18/17 1230 03/18/17 1427  BP: (!) 162/98 (!) 156/100  Pulse:  66  Resp:  16  Temp:  36.7 C  SpO2:  97%    Last Pain:  Vitals:   03/18/17 1427  TempSrc: Oral  PainSc:                  Cecile Hearing

## 2017-03-18 NOTE — Progress Notes (Signed)
Michele Petersen  is a 61 y.o. female, w hypertension, CAD s/p nstemi (? 90% distal LAD, unclear), who presents with mechanical fall down 3-4 stair and has distal femur fracture. S/p closed reduction and retrograde intramedullary nailing. Plan for PT eval in am.  Restart losartan.    Kathlen Mody, MD [phone]

## 2017-03-18 NOTE — ED Notes (Signed)
Patient transported to CT 

## 2017-03-18 NOTE — Transfer of Care (Signed)
Immediate Anesthesia Transfer of Care Note  Patient: Michele Petersen  Procedure(s) Performed: Procedure(s): INTRAMEDULLARY (IM) RETROGRADE FEMORAL NAILING (Left)  Patient Location: PACU  Anesthesia Type:General  Level of Consciousness: awake, alert , oriented and patient cooperative  Airway & Oxygen Therapy: Patient Spontanous Breathing and Patient connected to nasal cannula oxygen  Post-op Assessment: Report given to RN and Post -op Vital signs reviewed and stable  Post vital signs: Reviewed and stable  Last Vitals:  Vitals:   03/18/17 0130 03/18/17 0246  BP: (!) 154/93 (!) 159/80  Pulse: (!) 54 (!) 57  Resp:  15  Temp:  37.2 C  SpO2: 94% 96%    Last Pain:  Vitals:   03/18/17 0333  TempSrc:   PainSc: Asleep      Patients Stated Pain Goal: 1 (03/18/17 0303)  Complications: No apparent anesthesia complications

## 2017-03-18 NOTE — Discharge Instructions (Signed)
° ° °  1. Change dressings as needed °2. May shower but keep incisions covered and dry °3. Take lovenox to prevent blood clots °4. Take stool softeners as needed °5. Take pain meds as needed ° °

## 2017-03-18 NOTE — H&P (Addendum)
TRH H&P   Patient Demographics:    Michele Petersen, is a 61 y.o. female  MRN: [id number]   DOB - May 27, 1956  Admit Date - 03/17/2017  Outpatient Primary MD for the patient is [facility]   Ronalee Belts   Referring MD/NP/PA: Dr. Manus Gunning  Outpatient Specialists:   Patient coming from: home  Chief Complaint  Patient presents with  . Fall      HPI:    Michele Petersen  is a 61 y.o. female, w hypertension, CAD s/p nstemi (? 90% distal LAD, unclear), who presents with mechanical fall down 3-4 stair and has distal femur fracture.    In ED, xray confirmed distal femur fracture.   IMPRESSION: Acute comminuted oblique fracture of the lower femoral diaphysis with 3 major fracture components. One shaft's with medial displacement of proximal femur relative to distal femur. Hip, knee, and ankle joints are maintained.  Glucose = 148  Hgb 11.9,  Wbc 8.8,  Orthopedics consulted by ED requested medical admission and CT scan and to OR later today.     Review of systems:    In addition to the HPI above,  No Fever-chills, No Headache, No changes with Vision or hearing, No problems swallowing food or Liquids, No Chest pain, Cough or Shortness of Breath, No Abdominal pain, No Nausea or Vommitting, Bowel movements are regular, No Blood in stool or Urine, No dysuria, No new skin rashes or bruises,  No new weakness, tingling, numbness in any extremity, No recent weight gain or loss, No polyuria, polydypsia or polyphagia, No significant Mental Stressors.  A full 10 point Review of Systems was done, except as stated above, all other Review of Systems were negative.   With Past History of the following :    Past Medical History:  Diagnosis Date  . Hypertension       Past Surgical History:  Procedure Laterality Date  . CESAREAN SECTION        Social History:      Social History  Substance Use Topics  . Smoking status: Never Smoker  . Smokeless tobacco: Never Used  . Alcohol use No     Lives -  At home   Mobility -  Walks by self   Family History :    No family history on file. Pt doesn't recall   Home Medications:   Prior to Admission medications   Not on File     Allergies:    No Known Allergies   Physical Exam:   Vitals  Blood pressure (!) 194/92, pulse 65, resp. rate 20, height  (1.626 m), weight 98 kg (216 lb), SpO2 98 %.   1. General  lying in bed in NAD,    2. Normal affect and insight, Not Suicidal or Homicidal, Awake Alert, Oriented X 3.  3. No F.N deficits, ALL C.Nerves Intact, Strength 5/5 all  4 extremities, Sensation intact all 4 extremities, Plantars down going.  4. Ears and Eyes appear Normal, Conjunctivae clear, PERRLA. Moist Oral Mucosa.  5. Supple Neck, No JVD, No cervical lymphadenopathy appriciated, No Carotid Bruits.  6. Symmetrical Chest wall movement, Good air movement bilaterally, CTAB.  7. RRR, No Gallops, Rubs or Murmurs, No Parasternal Heave.  8. Positive Bowel Sounds, Abdomen Soft, No tenderness, No organomegaly appriciated,No rebound -guarding or rigidity.  9.  No Cyanosis, Normal Skin Turgor, No Skin Rash or Bruise.  10. Good muscle tone,  joints appear normal , no effusions, Normal ROM.  11. No Palpable Lymph Nodes in Neck or Axillae     Data Review:    CBC  Recent Labs Lab 03/18/17 0031  WBC 8.8  HGB 11.9*  HCT 37.1  PLT 136*  MCV 86.5  MCH 27.7  MCHC 32.1  RDW 13.6  LYMPHSABS 1.1  MONOABS 0.3  EOSABS 0.1  BASOSABS 0.0   ------------------------------------------------------------------------------------------------------------------  Chemistries   Recent Labs Lab 03/18/17 0031  NA 138  K 3.8  CL 107  CO2 24  GLUCOSE 148*  BUN 17  CREATININE 1.11*  CALCIUM 8.8*    ------------------------------------------------------------------------------------------------------------------ estimated creatinine clearance is 61.3 mL/min (A) (by C-G formula based on SCr of 1.11 mg/dL (H)). ------------------------------------------------------------------------------------------------------------------ No results for input(s): TSH, T4TOTAL, T3FREE, THYROIDAB in the last 72 hours.  Invalid input(s): FREET3  Coagulation profile No results for input(s): INR, PROTIME in the last 168 hours. ------------------------------------------------------------------------------------------------------------------- No results for input(s): DDIMER in the last 72 hours. -------------------------------------------------------------------------------------------------------------------  Cardiac Enzymes No results for input(s): CKMB, TROPONINI, MYOGLOBIN in the last 168 hours.  Invalid input(s): CK ------------------------------------------------------------------------------------------------------------------ No results found for: BNP   ---------------------------------------------------------------------------------------------------------------  Urinalysis No results found for: COLORURINE, APPEARANCEUR, LABSPEC, PHURINE, GLUCOSEU, HGBUR, BILIRUBINUR, KETONESUR, PROTEINUR, UROBILINOGEN, NITRITE, LEUKOCYTESUR  ----------------------------------------------------------------------------------------------------------------   Imaging Results:    Dg Knee 1-2 Views Left  Result Date: 03/18/2017 CLINICAL DATA:  61 y/o  F; status post fall. EXAM: LEFT FEMUR 2 VIEWS; DG HIP (WITH OR WITHOUT PELVIS) 2-3V LEFT; LEFT KNEE - 1-2 VIEW; LEFT TIBIA AND FIBULA - 2 VIEW COMPARISON:  None. FINDINGS: Left hip: There is no evidence of fracture or other focal bone lesions. Soft tissues are unremarkable. Left femur: Acute comminuted oblique fracture of the lower femoral diaphysis with 3 major  fracture components. There is 1 shaft's medial displacement of the proximal femur relative to the distal femur. Left knee: Comminuted lower femur fracture as described above. The knee joint is well aligned with mild medial femorotibial compartment joint space narrowing and small periarticular osteophytes. Left tibia and fibula: There is no evidence of fracture or other focal bone lesions. Soft tissues are unremarkable. Large plantar calcaneal enthesophyte. IMPRESSION: Acute comminuted oblique fracture of the lower femoral diaphysis with 3 major fracture components. One shaft's with medial displacement of proximal femur relative to distal femur. Hip, knee, and ankle joints are maintained. Electronically Signed   By: Mitzi Hansen M.D.   On: 03/18/2017 00:27   Dg Tibia/fibula Left  Result Date: 03/18/2017 CLINICAL DATA:  61 y/o  F; status post fall. EXAM: LEFT FEMUR 2 VIEWS; DG HIP (WITH OR WITHOUT PELVIS) 2-3V LEFT; LEFT KNEE - 1-2 VIEW; LEFT TIBIA AND FIBULA - 2 VIEW COMPARISON:  None. FINDINGS: Left hip: There is no evidence of fracture or other focal bone lesions. Soft tissues are unremarkable. Left femur: Acute comminuted oblique fracture of the lower femoral diaphysis with 3 major fracture components. There is 1 shaft's medial  displacement of the proximal femur relative to the distal femur. Left knee: Comminuted lower femur fracture as described above. The knee joint is well aligned with mild medial femorotibial compartment joint space narrowing and small periarticular osteophytes. Left tibia and fibula: There is no evidence of fracture or other focal bone lesions. Soft tissues are unremarkable. Large plantar calcaneal enthesophyte. IMPRESSION: Acute comminuted oblique fracture of the lower femoral diaphysis with 3 major fracture components. One shaft's with medial displacement of proximal femur relative to distal femur. Hip, knee, and ankle joints are maintained. Electronically Signed   By: Mitzi Hansen M.D.   On: 03/18/2017 00:27   Dg Hip Unilat W Or Wo Pelvis 2-3 Views Left  Result Date: 03/18/2017 CLINICAL DATA:  61 y/o  F; status post fall. EXAM: LEFT FEMUR 2 VIEWS; DG HIP (WITH OR WITHOUT PELVIS) 2-3V LEFT; LEFT KNEE - 1-2 VIEW; LEFT TIBIA AND FIBULA - 2 VIEW COMPARISON:  None. FINDINGS: Left hip: There is no evidence of fracture or other focal bone lesions. Soft tissues are unremarkable. Left femur: Acute comminuted oblique fracture of the lower femoral diaphysis with 3 major fracture components. There is 1 shaft's medial displacement of the proximal femur relative to the distal femur. Left knee: Comminuted lower femur fracture as described above. The knee joint is well aligned with mild medial femorotibial compartment joint space narrowing and small periarticular osteophytes. Left tibia and fibula: There is no evidence of fracture or other focal bone lesions. Soft tissues are unremarkable. Large plantar calcaneal enthesophyte. IMPRESSION: Acute comminuted oblique fracture of the lower femoral diaphysis with 3 major fracture components. One shaft's with medial displacement of proximal femur relative to distal femur. Hip, knee, and ankle joints are maintained. Electronically Signed   By: Mitzi Hansen M.D.   On: 03/18/2017 00:27   Dg Femur Min 2 Views Left  Result Date: 03/18/2017 CLINICAL DATA:  61 y/o  F; status post fall. EXAM: LEFT FEMUR 2 VIEWS; DG HIP (WITH OR WITHOUT PELVIS) 2-3V LEFT; LEFT KNEE - 1-2 VIEW; LEFT TIBIA AND FIBULA - 2 VIEW COMPARISON:  None. FINDINGS: Left hip: There is no evidence of fracture or other focal bone lesions. Soft tissues are unremarkable. Left femur: Acute comminuted oblique fracture of the lower femoral diaphysis with 3 major fracture components. There is 1 shaft's medial displacement of the proximal femur relative to the distal femur. Left knee: Comminuted lower femur fracture as described above. The knee joint is well aligned with  mild medial femorotibial compartment joint space narrowing and small periarticular osteophytes. Left tibia and fibula: There is no evidence of fracture or other focal bone lesions. Soft tissues are unremarkable. Large plantar calcaneal enthesophyte. IMPRESSION: Acute comminuted oblique fracture of the lower femoral diaphysis with 3 major fracture components. One shaft's with medial displacement of proximal femur relative to distal femur. Hip, knee, and ankle joints are maintained. Electronically Signed   By: Mitzi Hansen M.D.   On: 03/18/2017 00:27      Assessment & Plan:    Active Problems:   Femur fracture (HCC)    L femur fracture NPO Ns iv Orthopedic consult much appreciated Pt is medically cleared to have surgery  Recommend bone density testing as outpatient  Hypertension Cont Toprol XL  po qday  CAD Cont aspirin, Toprol, Lipitor  Glucose intolerance Check hga1c  Depression Cont celexa    DVT Prophylaxis Lovenox - SCDs  AM Labs Ordered, also please review Full Orders  Family Communication: Admission, patients condition and  plan of care including tests being ordered have been discussed with the patient  who indicate understanding and agree with the plan and Code Status.  Code Status FULL CODE  Likely DC to  TBD  Condition GUARDED    Consults called: orthopedics by ED  Admission status: inpatient  Time spent in minutes : 45   Pearson Grippe M.D on 03/18/2017 at 1:23 AM  Between 7am to 7pm - Pager - [phone] . After 7pm go to [email or web address] - password [email or web address]  [facility] - Office  [phone]

## 2017-03-18 NOTE — Anesthesia Preprocedure Evaluation (Addendum)
Anesthesia Evaluation  Patient identified by MRN, date of birth, ID band Patient awake    Reviewed: Allergy & Precautions, NPO status , Patient's Chart, lab work & pertinent test results, reviewed documented beta blocker date and time   Airway Mallampati: II  TM Distance: >3 FB Neck ROM: Full    Dental  (+) Dental Advisory Given, Partial Upper   Pulmonary neg pulmonary ROS,    Pulmonary exam normal breath sounds clear to auscultation       Cardiovascular hypertension, Pt. on medications and Pt. on home beta blockers + CAD and + Past MI  Normal cardiovascular exam Rhythm:Regular Rate:Normal     Neuro/Psych negative neurological ROS     GI/Hepatic negative GI ROS, Neg liver ROS,   Endo/Other  negative endocrine ROS  Renal/GU negative Renal ROS     Musculoskeletal Distal femur fracture    Abdominal   Peds  Hematology  (+) Blood dyscrasia, anemia , Plt 136k   Anesthesia Other Findings Day of surgery medications reviewed with the patient.  Reproductive/Obstetrics                            Anesthesia Physical Anesthesia Plan  ASA: III  Anesthesia Plan: General   Post-op Pain Management:    Induction: Intravenous  PONV Risk Score and Plan: 3 and Ondansetron, Dexamethasone, Midazolam and Treatment may vary due to age or medical condition  Airway Management Planned: Oral ETT  Additional Equipment:   Intra-op Plan:   Post-operative Plan: Extubation in OR  Informed Consent: I have reviewed the patients History and Physical, chart, labs and discussed the procedure including the risks, benefits and alternatives for the proposed anesthesia with the patient or authorized representative who has indicated his/her understanding and acceptance.   Dental advisory given  Plan Discussed with: CRNA  Anesthesia Plan Comments: (Risks/benefits of general anesthesia discussed with patient including  risk of damage to teeth, lips, gum, and tongue, nausea/vomiting, allergic reactions to medications, and the possibility of heart attack, stroke and death.  All patient questions answered.  Patient wishes to proceed.)       Anesthesia Quick Evaluation

## 2017-03-18 NOTE — Progress Notes (Signed)
Dentures and 2 rings given to [place] at bedside.

## 2017-03-18 NOTE — ED Notes (Signed)
Attempted report 

## 2017-03-18 NOTE — Progress Notes (Signed)
Orthopedic Tech Progress Note Patient Details:  Michele Petersen 04-23-1956 [id number]  Ortho Devices Type of Ortho Device: Knee Immobilizer Ortho Device/Splint Location: lle Ortho Device/Splint Interventions: Ordered, Application, Adjustment   Trinna Post 03/18/2017, 3:01 AM

## 2017-03-18 NOTE — Op Note (Signed)
   Date of Surgery: 03/18/2017  INDICATIONS: Ms. Pierron is a 61 y.o.-year-old female who fell down her stairs and sustained a left femur fracture. The risks and benefits of the procedure discussed with the patient prior to the procedure and all questions were answered; consent was obtained.  PREOPERATIVE DIAGNOSIS:  left femur fracture  POSTOPERATIVE DIAGNOSIS: Same  PROCEDURE:  left femur closed reduction and retrograde intramedullary nailing.  CPT [phone]  SURGEON: N. Glee Arvin, M.D.  ASSISTANT: none,   ANESTHESIA:  general  IV FLUIDS AND URINE: See anesthesia record.  ESTIMATED BLOOD LOSS: minimal mL.  IMPLANTS: Biomet 12 x 360.   DRAINS: None.  COMPLICATIONS: None.  DESCRIPTION OF PROCEDURE: The patient was brought to the operating room and placed supine on the operating table.  The patient's leg had been signed prior to the procedure and this was documented.  The patient had the anesthesia placed by the anesthesiologist.  The prep verification and incision time-outs were performed to confirm that this was the correct patient, site, side and location. The patient had an SCD on the opposite lower extremity. The patient did receive antibiotics prior to the incision and was re-dosed during the procedure as needed at indicated intervals.  A patellar tendon splitting approach was used. The fat pad was removed. Guidepin was placed at the distal femur at the appropriate starting position using fluoroscopic guidance. The pin was then advanced into the canal. An opening reamer was then used over the guidepin to gain entry into the femoral canal. The femur was then pulled out to length and reduction. A ball tipped wire was then advanced down the canal across the fracture up to the proximal femur. The length of the nail was then measured to be 360 mm. Sequential reaming was then  performed up to a 13-1/2 mm reamer with adequate chatter. A 12 x 360 retrograde femoral nail was then advanced over  the guidewire to the appropriate depth using fluoroscopic guidance. We then placed 3 distal interlocking screws through the jig. The set screw was then locked in place 2 make it a fixed angle construct. We then placed 2 proximal interlocking screws using the perfect circles technique. Final x-rays were taken. Wounds were thoroughly irrigated and closed in layer fashion using 0 Vicryl, 2-0 Vicryl, staples for the skin. Sterile dressings were applied. Patient tolerated procedure well and no immediate competitions.  POSTOPERATIVE PLAN: Ms. Schlotter will be TDWB and will return 2 weeks for suture removal;  she will not need any x-rays at that time.  Ms. Albino will receive DVT prophylaxis based on other medications, activity level, and risk ratio of bleeding to thrombosis.

## 2017-03-19 LAB — CBC
HEMATOCRIT: 31.5 % — AB (ref 36.0–46.0)
Hemoglobin: 10 g/dL — ABNORMAL LOW (ref 12.0–15.0)
MCH: 27.5 pg (ref 26.0–34.0)
MCHC: 31.7 g/dL (ref 30.0–36.0)
MCV: 86.8 fL (ref 78.0–100.0)
Platelets: 130 10*3/uL — ABNORMAL LOW (ref 150–400)
RBC: 3.63 MIL/uL — ABNORMAL LOW (ref 3.87–5.11)
RDW: 13.5 % (ref 11.5–15.5)
WBC: 13.6 10*3/uL — ABNORMAL HIGH (ref 4.0–10.5)

## 2017-03-19 LAB — HEMOGLOBIN A1C
Hgb A1c MFr Bld: 5.6 % (ref 4.8–5.6)
MEAN PLASMA GLUCOSE: 114.02 mg/dL

## 2017-03-19 LAB — COMPREHENSIVE METABOLIC PANEL
ALBUMIN: 3.2 g/dL — AB (ref 3.5–5.0)
ALK PHOS: 45 U/L (ref 38–126)
ALT: 14 U/L (ref 14–54)
AST: 22 U/L (ref 15–41)
Anion gap: 8 (ref 5–15)
BILIRUBIN TOTAL: 0.5 mg/dL (ref 0.3–1.2)
BUN: 18 mg/dL (ref 6–20)
CALCIUM: 8.4 mg/dL — AB (ref 8.9–10.3)
CO2: 25 mmol/L (ref 22–32)
Chloride: 105 mmol/L (ref 101–111)
Creatinine, Ser: 1.12 mg/dL — ABNORMAL HIGH (ref 0.44–1.00)
GFR calc Af Amer: >60 mL/min (ref >60)
GFR calc non Af Amer: 52 mL/min — ABNORMAL LOW (ref >60)
GLUCOSE: 145 mg/dL — AB (ref 65–99)
POTASSIUM: 4 mmol/L (ref 3.5–5.1)
Sodium: 138 mmol/L (ref 135–145)
TOTAL PROTEIN: 5.2 g/dL — AB (ref 6.5–8.1)

## 2017-03-19 NOTE — Evaluation (Addendum)
Physical Therapy Evaluation Patient Details Name: Michele Petersen MRN: [id number] DOB: [birth year] Today's Date: 03/19/2017   History of Present Illness  62 y.o.female,w hypertension, CAD s/p nstemi (? 90% distal LAD, unclear), who presents with mechanical fall down 3-4 stair and has distal femur fracture. In ED, xray confirmed distal femur fracture. Underwent  left femur closed reduction and retrograde intramedullary nailing on 03/18/17.  Clinical Impression  Pt admitted with above diagnosis. Pt currently with functional limitations due to the deficits listed below (see PT Problem List). On eval, pt required min assist bed mobility, min assist transfers, and min assist ambulation with RW 10 feet. Verbal cues required for TDWB LLE. Pt in knee immobilizer on arrival but no order in chart. Dr. Roda Shutters contacted and reports KI may be removed as well as no ROM restrictions L hip/knee/ankle.  Pt will benefit from skilled PT to increase their independence and safety with mobility to allow discharge to the venue listed below. Pt is currently in 2nd floor apt. Family will have her moved into 1st floor handicap accessible apt by next weekend. If she d/c's from the hospital before the move is complete, pt will stay with her daughter.     Follow Up Recommendations No PT follow up;Supervision for mobility/OOB (Recommend OPPT when WB status increased.)    Equipment Recommendations  Rolling walker with 5" wheels;Wheelchair cushion (measurements PT);Wheelchair (measurements PT)    Recommendations for Other Services       Precautions / Restrictions Precautions Precautions: Fall Restrictions Weight Bearing Restrictions: Yes LLE Weight Bearing: Touchdown weight bearing Other Position/Activity Restrictions: Per MD OP note, pt TDWB      Mobility  Bed Mobility Overal bed mobility: Needs Assistance Bed Mobility: Supine to Sit     Supine to sit: Min assist     General bed mobility comments: +rail, assist with  LLE  Transfers Overall transfer level: Needs assistance Equipment used: Rolling walker (2 wheeled) Transfers: Sit to/from Stand Sit to Stand: Min assist         General transfer comment: verbal cues for hand placement  Ambulation/Gait Ambulation/Gait assistance: Min assist Ambulation Distance (Feet): 10 Feet Assistive device: Rolling walker (2 wheeled) Gait Pattern/deviations: Step-to pattern Gait velocity: decreased Gait velocity interpretation: Below normal speed for age/gender General Gait Details: verbal cues for TDWB LLE  Stairs            Wheelchair Mobility    Modified Rankin (Stroke Patients Only)       Balance Overall balance assessment: Needs assistance Sitting-balance support: No upper extremity supported;Feet supported Sitting balance-Leahy Scale: Normal     Standing balance support: Bilateral upper extremity supported;During functional activity Standing balance-Leahy Scale: Fair Standing balance comment: RW needed for ambulation                             Pertinent Vitals/Pain Pain Assessment: Faces Faces Pain Scale: Hurts a little bit Pain Location: RLE Pain Descriptors / Indicators: Sore Pain Intervention(s): Repositioned;Monitored during session    Home Living Family/patient expects to be discharged to:: Private residence Living Arrangements: Spouse/significant other Available Help at Discharge: Family;Available 24 hours/day Type of Home: Apartment Home Access: Level entry     Home Layout: One level Home Equipment: Shower seat      Prior Function Level of Independence: Independent         Comments: ADLs, IADLs. not driving     Hand Dominance  Extremity/Trunk Assessment   Upper Extremity Assessment Upper Extremity Assessment: Defer to OT evaluation    Lower Extremity Assessment Lower Extremity Assessment: LLE deficits/detail LLE: Unable to fully assess due to pain    Cervical / Trunk  Assessment Cervical / Trunk Assessment: Normal  Communication   Communication: Prefers language other than English (family interpreted)  Cognition Arousal/Alertness: Awake/alert Behavior During Therapy: WFL for tasks assessed/performed Overall Cognitive Status: Within Functional Limits for tasks assessed                                        General Comments      Exercises     Assessment/Plan    PT Assessment Patient needs continued PT services  PT Problem List Decreased activity tolerance;Decreased balance;Decreased mobility;Decreased knowledge of use of DME;Pain       PT Treatment Interventions DME instruction;Gait training;Functional mobility training;Therapeutic activities;Therapeutic exercise;Balance training;Patient/family education    PT Goals (Current goals can be found in the Care Plan section)  Acute Rehab PT Goals Patient Stated Goal: home PT Goal Formulation: With patient/family Time For Goal Achievement: 03/26/17 Potential to Achieve Goals: Good    Frequency Min 5X/week   Barriers to discharge        Co-evaluation PT/OT/SLP Co-Evaluation/Treatment: Yes Reason for Co-Treatment: For patient/therapist safety PT goals addressed during session: Mobility/safety with mobility;Balance         AM-PAC PT "6 Clicks" Daily Activity  Outcome Measure Difficulty turning over in bed (including adjusting bedclothes, sheets and blankets)?: A Little Difficulty moving from lying on back to sitting on the side of the bed? : Unable Difficulty sitting down on and standing up from a chair with arms (e.g., wheelchair, bedside commode, etc,.)?: Unable Help needed moving to and from a bed to chair (including a wheelchair)?: A Little Help needed walking in hospital room?: A Little Help needed climbing 3-5 steps with a railing? : A Lot 6 Click Score: 13    End of Session Equipment Utilized During Treatment: Gait belt Activity Tolerance: Patient tolerated  treatment well Patient left: in chair;with call bell/phone within reach;with family/visitor present Nurse Communication: Mobility status PT Visit Diagnosis: Difficulty in walking, not elsewhere classified (R26.2);Pain Pain - Right/Left: Left Pain - part of body: Leg    Time: 0929-0959 PT Time Calculation (min) (ACUTE ONLY): 30 min   Charges:   PT Evaluation $PT Eval Moderate Complexity: 1 Mod     PT G Codes:        Aida Raider, PT  Office # [phone] Pager [phone]   Ilda Foil 03/19/2017, 10:56 AM

## 2017-03-19 NOTE — Clinical Social Work Note (Signed)
CSW acknowledges SNF consult. PT evaluated today and are recommending "No PT follow up; Supervision for mobility/OOB (Recommend OPPT when WB status increase)."   CSW signing off. Consult again if any social work needs arise.  Charlynn Court, CSW [phone]

## 2017-03-19 NOTE — Progress Notes (Signed)
PROGRESS NOTE    Michele Petersen  WJX:[id number] DOB: 12/31/1955 DOA: 03/17/2017 PCP: [facility], [facility]  Brief Narrative: [name] a 61 y.o.female,w hypertension, CAD s/p nstemi (? 90% distal LAD, unclear), who presents with mechanical fall down 3-4 stair and has distal femur fracture. S/p closed reduction and retrograde intramedullary nailing. She worked with PT today and recommended no follow up needed.   Assessment & Plan:   Active Problems:   Closed displaced oblique fracture of shaft of left femur (HCC)   Glucose intolerance   CAD (coronary artery disease)   Closed displaced oblique fracture of the left femur:  S/p closed reductin and intramedullary nail placement.  Pain control and PT.    ANEMIA of blood loss:  Monitor.    Leukocytosis probably reactive.    Hyperglycemia:  Get hgba1c .      DVT prophylaxis: (Lovenox) Code Status: full code.  Family Communication: multiple family members at bedside.  Disposition Plan: home in am.    Consultants:   Orthopedidcs.   Procedures: left femur closed reduction and retrograde intramedullary nailing by Dr Roda Shutters on 9/29 Antimicrobials: none.   Subjective: Spanish speaking, denies any new complaints.   Objective: Vitals:   03/18/17 1427 03/18/17 2100 03/19/17 0500 03/19/17 1507  BP: (!) 156/100 (!) 142/77 124/65 122/62  Pulse: 66 84 68 75  Resp: Temp: 98 F (36.7 C) 99.7 F (37.6 C) 98.2 F (36.8 C) 98.5 F (36.9 C)  TempSrc: Oral Oral Oral Oral  SpO2: 97% 91% 94% 95%  Weight:      Height:       No intake or output data in the 24 hours ending 03/19/17 1556 Filed Weights   03/17/17 2303 03/18/17 0345  Weight: 98 kg (216 lb) 99.5 kg (219 lb 4 oz)    Examination:  General exam: Appears calm and comfortable sitting in the chair Respiratory system: Clear to auscultation. Respiratory effort normal. Cardiovascular system: S1 & S2 heard, RRR. No JVD, murmurs, rubs, gallops or  clicks. No pedal edema. Gastrointestinal system: Abdomen is nondistended, soft and nontender. No organomegaly or masses felt. Normal bowel sounds heard. Central nervous system: Alert and oriented. No focal neurological deficits. Extremities: left lower extremity bandaged and draining bloody drainage.  Skin: No rashes, lesions or ulcers Psychiatry: Judgement and insight appear normal. Mood & affect appropriate.     Data Reviewed: I have personally reviewed following labs and imaging studies  CBC:  Recent Labs Lab 03/18/17 0031 03/19/17 0405  WBC 8.8 13.6*  NEUTROABS 7.2  --   HGB 11.9* 10.0*  HCT 37.1 31.5*  MCV 86.5 86.8  PLT 136* 130*   Basic Metabolic Panel:  Recent Labs Lab 03/18/17 0031 03/19/17 0405  NA 138 138  K 3.8 4.0  CL 107 105  CO2 24 25  GLUCOSE 148* 145*  BUN 17 18  CREATININE 1.11* 1.12*  CALCIUM 8.8* 8.4*   GFR: Estimated Creatinine Clearance: 61.2 mL/min (A) (by C-G formula based on SCr of 1.12 mg/dL (H)). Liver Function Tests:  Recent Labs Lab 03/18/17 0031 03/19/17 0405  AST 18 22  ALT 17 14  ALKPHOS 67 45  BILITOT 0.6 0.5  PROT 6.4* 5.2*  ALBUMIN 3.8 3.2*   No results for input(s): LIPASE, AMYLASE in the last 168 hours. No results for input(s): AMMONIA in the last 168 hours. Coagulation Profile:  Recent Labs Lab 03/18/17 0341  INR 1.00   Cardiac Enzymes: No results for input(s): CKTOTAL, CKMB,  CKMBINDEX, TROPONINI in the last 168 hours. BNP (last 3 results) No results for input(s): PROBNP in the last 8760 hours. HbA1C:  Recent Labs  03/18/17 0341  HGBA1C 5.6   CBG: No results for input(s): GLUCAP in the last 168 hours. Lipid Profile: No results for input(s): CHOL, HDL, LDLCALC, TRIG, CHOLHDL, LDLDIRECT in the last 72 hours. Thyroid Function Tests: No results for input(s): TSH, T4TOTAL, FREET4, T3FREE, THYROIDAB in the last 72 hours. Anemia Panel: No results for input(s): VITAMINB12, FOLATE, FERRITIN, TIBC, IRON,  RETICCTPCT in the last 72 hours. Sepsis Labs: No results for input(s): PROCALCITON, LATICACIDVEN in the last 168 hours.  Recent Results (from the past 240 hour(s))  Surgical pcr screen     Status: None   Collection Time: 03/18/17  2:55 AM  Result Value Ref Range Status   MRSA, PCR NEGATIVE NEGATIVE Final   Staphylococcus aureus NEGATIVE NEGATIVE Final    Comment: (NOTE) The Xpert SA Assay (FDA approved for NASAL specimens in patients 65 years of age and older), is one component of a comprehensive surveillance program. It is not intended to diagnose infection nor to guide or monitor treatment.          Radiology Studies: Dg Knee 1-2 Views Left  Result Date: 03/18/2017 CLINICAL DATA:  61 y/o  F; status post fall. EXAM: LEFT FEMUR 2 VIEWS; DG HIP (WITH OR WITHOUT PELVIS) 2-3V LEFT; LEFT KNEE - 1-2 VIEW; LEFT TIBIA AND FIBULA - 2 VIEW COMPARISON:  None. FINDINGS: Left hip: There is no evidence of fracture or other focal bone lesions. Soft tissues are unremarkable. Left femur: Acute comminuted oblique fracture of the lower femoral diaphysis with 3 major fracture components. There is 1 shaft's medial displacement of the proximal femur relative to the distal femur. Left knee: Comminuted lower femur fracture as described above. The knee joint is well aligned with mild medial femorotibial compartment joint space narrowing and small periarticular osteophytes. Left tibia and fibula: There is no evidence of fracture or other focal bone lesions. Soft tissues are unremarkable. Large plantar calcaneal enthesophyte. IMPRESSION: Acute comminuted oblique fracture of the lower femoral diaphysis with 3 major fracture components. One shaft's with medial displacement of proximal femur relative to distal femur. Hip, knee, and ankle joints are maintained. Electronically Signed   By: Mitzi Hansen M.D.   On: 03/18/2017 00:27   Dg Tibia/fibula Left  Result Date: 03/18/2017 CLINICAL DATA:  61 y/o  F;  status post fall. EXAM: LEFT FEMUR 2 VIEWS; DG HIP (WITH OR WITHOUT PELVIS) 2-3V LEFT; LEFT KNEE - 1-2 VIEW; LEFT TIBIA AND FIBULA - 2 VIEW COMPARISON:  None. FINDINGS: Left hip: There is no evidence of fracture or other focal bone lesions. Soft tissues are unremarkable. Left femur: Acute comminuted oblique fracture of the lower femoral diaphysis with 3 major fracture components. There is 1 shaft's medial displacement of the proximal femur relative to the distal femur. Left knee: Comminuted lower femur fracture as described above. The knee joint is well aligned with mild medial femorotibial compartment joint space narrowing and small periarticular osteophytes. Left tibia and fibula: There is no evidence of fracture or other focal bone lesions. Soft tissues are unremarkable. Large plantar calcaneal enthesophyte. IMPRESSION: Acute comminuted oblique fracture of the lower femoral diaphysis with 3 major fracture components. One shaft's with medial displacement of proximal femur relative to distal femur. Hip, knee, and ankle joints are maintained. Electronically Signed   By: Mitzi Hansen M.D.   On: 03/18/2017 00:27  Ct Knee Left Wo Contrast  Result Date: 03/18/2017 CLINICAL DATA:  Left femoral fracture after slip and fall down 3-4 stairs. EXAM: CT OF THE LEFT KNEE WITHOUT CONTRAST TECHNIQUE: Multidetector CT imaging of the knee was performed according to the standard protocol. Multiplanar CT image reconstructions were also generated. COMPARISON:  Left femur and knee radiographs FINDINGS: Bones/Joint/Cartilage An acute, partly included closed, comminuted spiral oblique fracture of the distal left femoral diaphysis is identified with 1 shaft width anterior and lateral displacement of the main distal fracture fragment which includes the femoral condyles. The more cephalad margin of a fracture is not entirely included on this study. No intra-articular extension. Trace suprapatellar joint effusion on the  axial images. The anterior margin of the fracture terminates just above the metadiaphysis. No knee joint dislocation. There is osteoarthritic joint space narrowing of the femorotibial compartment with spurring. Ligaments Suboptimally assessed by CT. Muscles and Tendons No intramuscular hemorrhage. Soft tissues The femoral and popliteal arteries appear to be spared osseous involvement. Mild soft tissue induration about the comminuted fractures noted however. No focal soft tissue hematoma. IMPRESSION: 1. An acute, closed, comminuted spiral oblique fracture of the distal left femoral diaphysis is identified with 1 shaft width anterior and lateral displacement of the main distal fracture fragment which includes the femoral condyles. 2. No intra-articular extension of the fracture. Trace joint effusion. Electronically Signed   By: Tollie Eth M.D.   On: 03/18/2017 02:51   Dg C-arm 1-60 Min  Result Date: 03/18/2017 CLINICAL DATA:  Intramedullary retrograde femoral nail. EXAM: DG C-ARM 61-120 MIN; LEFT FEMUR 2 VIEWS COMPARISON:  CT from earlier the same day FINDINGS: Intramedullary femoral nail placed retrograde, with distal interlocking and proximal interlocking screws. Distal femur fracture with improved alignment from preoperative imaging. No evidence of intraprocedural fracture. IMPRESSION: Fluoroscopy for femoral fracture fixation. Electronically Signed   By: Marnee Spring M.D.   On: 03/18/2017 11:21   Dg Hip Unilat W Or Wo Pelvis 2-3 Views Left  Result Date: 03/18/2017 CLINICAL DATA:  61 y/o  F; status post fall. EXAM: LEFT FEMUR 2 VIEWS; DG HIP (WITH OR WITHOUT PELVIS) 2-3V LEFT; LEFT KNEE - 1-2 VIEW; LEFT TIBIA AND FIBULA - 2 VIEW COMPARISON:  None. FINDINGS: Left hip: There is no evidence of fracture or other focal bone lesions. Soft tissues are unremarkable. Left femur: Acute comminuted oblique fracture of the lower femoral diaphysis with 3 major fracture components. There is 1 shaft's medial  displacement of the proximal femur relative to the distal femur. Left knee: Comminuted lower femur fracture as described above. The knee joint is well aligned with mild medial femorotibial compartment joint space narrowing and small periarticular osteophytes. Left tibia and fibula: There is no evidence of fracture or other focal bone lesions. Soft tissues are unremarkable. Large plantar calcaneal enthesophyte. IMPRESSION: Acute comminuted oblique fracture of the lower femoral diaphysis with 3 major fracture components. One shaft's with medial displacement of proximal femur relative to distal femur. Hip, knee, and ankle joints are maintained. Electronically Signed   By: Mitzi Hansen M.D.   On: 03/18/2017 00:27   Dg Femur Min 2 Views Left  Result Date: 03/18/2017 CLINICAL DATA:  Intramedullary retrograde femoral nail. EXAM: DG C-ARM 61-120 MIN; LEFT FEMUR 2 VIEWS COMPARISON:  CT from earlier the same day FINDINGS: Intramedullary femoral nail placed retrograde, with distal interlocking and proximal interlocking screws. Distal femur fracture with improved alignment from preoperative imaging. No evidence of intraprocedural fracture. IMPRESSION: Fluoroscopy for femoral fracture fixation.  Electronically Signed   By: Marnee Spring M.D.   On: 03/18/2017 11:21   Dg Femur Min 2 Views Left  Result Date: 03/18/2017 CLINICAL DATA:  61 y/o  F; status post fall. EXAM: LEFT FEMUR 2 VIEWS; DG HIP (WITH OR WITHOUT PELVIS) 2-3V LEFT; LEFT KNEE - 1-2 VIEW; LEFT TIBIA AND FIBULA - 2 VIEW COMPARISON:  None. FINDINGS: Left hip: There is no evidence of fracture or other focal bone lesions. Soft tissues are unremarkable. Left femur: Acute comminuted oblique fracture of the lower femoral diaphysis with 3 major fracture components. There is 1 shaft's medial displacement of the proximal femur relative to the distal femur. Left knee: Comminuted lower femur fracture as described above. The knee joint is well aligned with  mild medial femorotibial compartment joint space narrowing and small periarticular osteophytes. Left tibia and fibula: There is no evidence of fracture or other focal bone lesions. Soft tissues are unremarkable. Large plantar calcaneal enthesophyte. IMPRESSION: Acute comminuted oblique fracture of the lower femoral diaphysis with 3 major fracture components. One shaft's with medial displacement of proximal femur relative to distal femur. Hip, knee, and ankle joints are maintained. Electronically Signed   By: Mitzi Hansen M.D.   On: 03/18/2017 00:27   Dg Femur Port Min 2 Views Left  Result Date: 03/18/2017 CLINICAL DATA:  61 year old female with a history of femur fracture EXAM: LEFT FEMUR PORTABLE 2 VIEWS COMPARISON:  03/17/2017 FINDINGS: Interval surgical changes of open reduction internal fixation of left femur fracture, with retrograde intramedullary rod placement and 2 proximal and 3 distal interlocking screws. Improved alignment at the fracture site. Expected soft tissue swelling and gas in the surgical bed with skin staples present. IMPRESSION: Interval surgical changes of open reduction internal fixation of left femur fracture, as above, with improved alignment at the fracture site. Electronically Signed   By: Gilmer Mor D.O.   On: 03/18/2017 12:02        Scheduled Meds: . aspirin EC  81 mg Oral Daily  . atorvastatin  20 mg Oral q1800  . citalopram  20 mg Oral Daily  . enoxaparin (LOVENOX) injection  40 mg Subcutaneous Q24H  . losartan  25 mg Oral Daily  . metoprolol succinate  25 mg Oral Daily   Continuous Infusions: . sodium chloride Stopped (03/18/17 1341)  . methocarbamol (ROBAXIN)  IV       LOS: 1 day    Time spent: 35 minutes    Dannie Woolen, MD [facility] Pager [phone]  If 7PM-7AM, please contact night-coverage [email or web address] Password [email or web address] 03/19/2017, 3:56 PM

## 2017-03-19 NOTE — Evaluation (Signed)
Occupational Therapy Evaluation Patient Details Name: Michele Petersen MRN: [id number] DOB: 05/01/1956 Today's Date: 03/19/2017    History of Present Illness 61 y.o.female,w hypertension, CAD s/p nstemi (? 90% distal LAD, unclear), who presents with mechanical fall down 3-4 stair and has distal femur fracture. In ED, xray confirmed distal femur fracture. Underwent  left femur closed reduction and retrograde intramedullary nailing on 03/18/17.   Clinical Impression   PTA, pt was living with her husband and was independent. Pt currently requiring Mod A for LB ADLs and Min A for functional mobility with RW. Pt would benefit from further acute OT to increase safety and independence with ADLs and functional mobility. Recommend dc home with 24 hour assist once medically stable per physician.     Follow Up Recommendations  No OT follow up;Supervision/Assistance - 24 hour    Equipment Recommendations  None recommended by OT    Recommendations for Other Services PT consult     Precautions / Restrictions Precautions Precautions: Fall Restrictions Weight Bearing Restrictions: Yes LLE Weight Bearing: Touchdown weight bearing Other Position/Activity Restrictions: Per MD OP note, pt TDWB      Mobility Bed Mobility Overal bed mobility: Needs Assistance Bed Mobility: Supine to Sit     Supine to sit: Min assist     General bed mobility comments: +rail, assist with LLE  Transfers Overall transfer level: Needs assistance Equipment used: Rolling walker (2 wheeled) Transfers: Sit to/from Stand Sit to Stand: Min assist         General transfer comment: verbal cues for hand placement    Balance Overall balance assessment: Needs assistance Sitting-balance support: No upper extremity supported;Feet supported Sitting balance-Leahy Scale: Normal     Standing balance support: Bilateral upper extremity supported;During functional activity Standing balance-Leahy Scale: Fair Standing  balance comment: RW needed for ambulation                           ADL either performed or assessed with clinical judgement   ADL Overall ADL's : Needs assistance/impaired Eating/Feeding: Set up;Sitting   Grooming: Set up;Sitting;Supervision/safety   Upper Body Bathing: Sitting;Set up;Supervision/ safety   Lower Body Bathing: Moderate assistance;Sit to/from stand   Upper Body Dressing : Sitting;Set up;Supervision/safety   Lower Body Dressing: Moderate assistance;Sit to/from stand Lower Body Dressing Details (indicate cue type and reason): Pt requiring assistance to bring underwear over feet and then over hips in standing while maintaining WB status. Pt would benefit from AE education Toilet Transfer: Minimal assistance;Ambulation;RW;Cueing for safety (Simulated to recliner) Toilet Transfer Details (indicate cue type and reason): Min A for safety         Functional mobility during ADLs: Minimal assistance;Rolling walker General ADL Comments: Pt demonstrating decreased fucntional performance. Requiring increased assistnace with LB ADLs while adhereing to WB status. Pt very motivated to return to PLOF' and family very supportive     Vision Baseline Vision/History: Wears glasses Wears Glasses: At all times Patient Visual Report: No change from baseline       Perception     Praxis      Pertinent Vitals/Pain Pain Assessment: Faces Faces Pain Scale: Hurts a little bit Pain Location: RLE Pain Descriptors / Indicators: Sore Pain Intervention(s): Monitored during session;Limited activity within patient's tolerance;Repositioned     Hand Dominance Right   Extremity/Trunk Assessment Upper Extremity Assessment Upper Extremity Assessment: Overall WFL for tasks assessed   Lower Extremity Assessment Lower Extremity Assessment: Defer to PT evaluation LLE: Unable to fully  assess due to pain   Cervical / Trunk Assessment Cervical / Trunk Assessment: Normal    Communication Communication Communication: Prefers language other than English (family interpreted)   Cognition Arousal/Alertness: Awake/alert Behavior During Therapy: WFL for tasks assessed/performed Overall Cognitive Status: Within Functional Limits for tasks assessed                                     General Comments  Family present throughout session. Pt bleeding through ace wrap at back of knee. RN notified    Exercises     Shoulder Instructions      Home Living Family/patient expects to be discharged to:: Private residence Living Arrangements: Spouse/significant other Available Help at Discharge: Family;Available 24 hours/day Type of Home: Apartment Home Access: Level entry     Home Layout: One level     Bathroom Shower/Tub: Tub/shower unit;Curtain   Bathroom Toilet: Standard Bathroom Accessibility: Yes How Accessible: Accessible via walker;Accessible via wheelchair Home Equipment: Shower seat          Prior Functioning/Environment Level of Independence: Independent        Comments: ADLs, IADLs. not driving        OT Problem List: Decreased range of motion;Impaired balance (sitting and/or standing);Decreased safety awareness;Decreased knowledge of use of DME or AE;Decreased knowledge of precautions;Pain      OT Treatment/Interventions: Self-care/ADL training;Therapeutic exercise;Energy conservation;DME and/or AE instruction;Therapeutic activities;Patient/family education    OT Goals(Current goals can be found in the care plan section) Acute Rehab OT Goals Patient Stated Goal: home OT Goal Formulation: With patient Time For Goal Achievement: 04/02/17 Potential to Achieve Goals: Good ADL Goals Pt Will Perform Grooming: with min guard assist;standing Pt Will Perform Lower Body Dressing: with min guard assist;sit to/from stand;with adaptive equipment Pt Will Transfer to Toilet: with min guard assist;ambulating;regular height toilet Pt  Will Perform Toileting - Clothing Manipulation and hygiene: with min guard assist;sit to/from stand Pt Will Perform Tub/Shower Transfer: Shower transfer;with min guard assist;ambulating;shower seat;rolling walker  OT Frequency: Min 3X/week   Barriers to D/C:            Co-evaluation PT/OT/SLP Co-Evaluation/Treatment: Yes Reason for Co-Treatment: For patient/therapist safety;To address functional/ADL transfers PT goals addressed during session: Mobility/safety with mobility;Balance OT goals addressed during session: ADL's and self-care      AM-PAC PT "6 Clicks" Daily Activity     Outcome Measure Help from another person eating meals?: None Help from another person taking care of personal grooming?: A Little Help from another person toileting, which includes using toliet, bedpan, or urinal?: A Little Help from another person bathing (including washing, rinsing, drying)?: A Lot Help from another person to put on and taking off regular upper body clothing?: None Help from another person to put on and taking off regular lower body clothing?: A Lot 6 Click Score: 18   End of Session Equipment Utilized During Treatment: Gait belt;Rolling walker Nurse Communication: Mobility status;Precautions;Weight bearing status  Activity Tolerance: Patient tolerated treatment well Patient left: in chair;with call bell/phone within reach;with family/visitor present  OT Visit Diagnosis: Unsteadiness on feet (R26.81);Other abnormalities of gait and mobility (R26.89);Muscle weakness (generalized) (M62.81);Pain Pain - Right/Left: Left Pain - part of body: Leg                Time: 4098-1191 OT Time Calculation (min): 28 min Charges:  OT General Charges $OT Visit: 1 Visit OT Evaluation $OT Eval Moderate Complexity:  1 Mod G-Codes:     Chief Strategy Officer MSOT, OTR/L Acute Rehab Pager: [phone] Office: [phone]  Theodoro Grist Charlotte Fidalgo 03/19/2017, 1:25 PM

## 2017-03-19 NOTE — Progress Notes (Addendum)
   Subjective:  Patient reports pain as mild.  No events  Objective:   VITALS:   Vitals:   03/18/17 1230 03/18/17 1427 03/18/17 2100 03/19/17 0500  BP: (!) 162/98 (!) 156/100 (!) 142/77 124/65  Pulse:  66 84 68  Resp:  Temp:  98 F (36.7 C) 99.7 F (37.6 C) 98.2 F (36.8 C)  TempSrc:  Oral Oral Oral  SpO2:  97% 91% 94%  Weight:      Height:        Neurologically intact Neurovascular intact Sensation intact distally Intact pulses distally Dorsiflexion/Plantar flexion intact Incision: dressing C/D/I and no drainage No cellulitis present Compartment soft   Lab Results  Component Value Date   WBC 13.6 (H) 03/19/2017   HGB 10.0 (L) 03/19/2017   HCT 31.5 (L) 03/19/2017   MCV 86.8 03/19/2017   PLT 130 (L) 03/19/2017     Assessment/Plan:  1 Day Post-Op   - Expected postop acute blood loss anemia - will monitor for symptoms - Up with PT/OT - DVT ppx - SCDs, ambulation, lovenox - TDWB operative extremity - Pain control - Discharge planning per hospitalist - doing very well and stable from ortho stand point  Glee Arvin 03/19/2017, 7:40 AM [phone]

## 2017-03-20 ENCOUNTER — Encounter (HOSPITAL_COMMUNITY): Payer: Self-pay | Admitting: Orthopaedic Surgery

## 2017-03-20 DIAGNOSIS — Z9889 Other specified postprocedural states: Secondary | ICD-10-CM

## 2017-03-20 LAB — BASIC METABOLIC PANEL
ANION GAP: 5 (ref 5–15)
BUN: 16 mg/dL (ref 6–20)
CHLORIDE: 108 mmol/L (ref 101–111)
CO2: 25 mmol/L (ref 22–32)
Calcium: 8.1 mg/dL — ABNORMAL LOW (ref 8.9–10.3)
Creatinine, Ser: 0.88 mg/dL (ref 0.44–1.00)
GFR calc Af Amer: >60 mL/min (ref >60)
GLUCOSE: 108 mg/dL — AB (ref 65–99)
POTASSIUM: 3.8 mmol/L (ref 3.5–5.1)
Sodium: 138 mmol/L (ref 135–145)

## 2017-03-20 LAB — CBC
HEMATOCRIT: 29.3 % — AB (ref 36.0–46.0)
Hemoglobin: 9.3 g/dL — ABNORMAL LOW (ref 12.0–15.0)
MCH: 27.5 pg (ref 26.0–34.0)
MCHC: 31.7 g/dL (ref 30.0–36.0)
MCV: 86.7 fL (ref 78.0–100.0)
Platelets: 98 10*3/uL — ABNORMAL LOW (ref 150–400)
RBC: 3.38 MIL/uL — ABNORMAL LOW (ref 3.87–5.11)
RDW: 13.8 % (ref 11.5–15.5)
WBC: 8.7 10*3/uL (ref 4.0–10.5)

## 2017-03-20 LAB — PLATELET COUNT: Platelets: 136 10*3/uL — ABNORMAL LOW (ref 150–400)

## 2017-03-20 NOTE — Progress Notes (Signed)
Physical Therapy Treatment Patient Details Name: Michele Petersen MRN: [id number] DOB: [birth year] Today's Date: 03/20/2017    History of Present Illness 61 y.o.female,w hypertension, CAD s/p nstemi (? 90% distal LAD, unclear), who presents with mechanical fall down 3-4 stair and has distal femur fracture. In ED, xray confirmed distal femur fracture. Underwent  left femur closed reduction and retrograde intramedullary nailing on 03/18/17.    PT Comments    Pt performed limited gait and attempted crutch training. Pt is not safe with crutches and will require a RW at d/c to ensure safe transfers and gait training at home.  Pt will still continue to benefit from a WC with elevating leg rests at d/c as she only has a transport chair and would be safe for community distance in a WC.  A WC will also allow her to increase her activity tolerance as she is only able to tolerate short gait distances of ambulation. Plan next session for WC mobility and continued strengthening exercises.     Follow Up Recommendations  No PT follow up;Supervision for mobility/OOB (Recommend OP PT when weight bearing is ordered.  )     Equipment Recommendations  Rolling walker with 5" wheels;Wheelchair cushion (measurements PT);Wheelchair (measurements PT)    Recommendations for Other Services       Precautions / Restrictions Precautions Precautions: Fall Restrictions Weight Bearing Restrictions: Yes LLE Weight Bearing: Touchdown weight bearing Other Position/Activity Restrictions: Per MD OP note, pt TDWB    Mobility  Bed Mobility               General bed mobility comments: Pt sitting in recliner on arrival.    Transfers Overall transfer level: Needs assistance Equipment used: Rolling walker (2 wheeled);Crutches Transfers: Sit to/from Stand Sit to Stand: Mod assist         General transfer comment: verbal cues for hand placement, pt performed sit to stand from recliner with use of crutches and  great deal of difficulty to manage crutches and balance at the same time.  Pt performed additional transfer from lower seated commode and require mod assist to boost into standing.  RW appears to be safer for use at home.    Ambulation/Gait Ambulation/Gait assistance: Min assist Ambulation Distance (Feet): 14 Feet (x2 trials.  ) Assistive device: Rolling walker (2 wheeled);Crutches Gait Pattern/deviations: Step-to pattern;Decreased stride length;Shuffle;Trunk flexed;Antalgic Gait velocity: decreased Gait velocity interpretation: Below normal speed for age/gender General Gait Details: Pt performed 2 steps with crutches but required mod assistance to maintain balance and standing.  Pt then switched from crutches to RW in standing and demonstrated improved balance and stability in standing.  Pt performed gait with cues for UE use to maintain TDWB in stance phase on LLE.  Pt required cues for sequencing and RW position for safety.     Stairs Stairs:  (Son reports patient is staying with her daughter who does not have stairs for entry into home.  )          Wheelchair Mobility    Modified Rankin (Stroke Patients Only)       Balance Overall balance assessment: Needs assistance   Sitting balance-Leahy Scale: Normal       Standing balance-Leahy Scale: Poor Standing balance comment: RW needed for ambulation                            Cognition Arousal/Alertness: Awake/alert Behavior During Therapy: WFL for tasks assessed/performed Overall Cognitive Status: Within  Functional Limits for tasks assessed                                        Exercises Total Joint Exercises Ankle Circles/Pumps: AROM;Both;20 reps;Supine Quad Sets: AROM;Left;10 reps;Supine Heel Slides: AAROM;Left;10 reps;Supine Hip ABduction/ADduction: AAROM;Left;10 reps;Supine Long Arc Quad: AAROM;Left;10 reps;Seated    General Comments        Pertinent Vitals/Pain Pain Assessment:  0-10 Pain Score: 8  Pain Location: RLE Pain Descriptors / Indicators: Sore Pain Intervention(s): Monitored during session;Repositioned;Ice applied    Home Living                      Prior Function            PT Goals (current goals can now be found in the care plan section) Acute Rehab PT Goals Patient Stated Goal: home Potential to Achieve Goals: Good Progress towards PT goals: Progressing toward goals    Frequency    Min 5X/week      PT Plan Current plan remains appropriate    Co-evaluation              AM-PAC PT "6 Clicks" Daily Activity  Outcome Measure  Difficulty turning over in bed (including adjusting bedclothes, sheets and blankets)?: A Little Difficulty moving from lying on back to sitting on the side of the bed? : Unable Difficulty sitting down on and standing up from a chair with arms (e.g., wheelchair, bedside commode, etc,.)?: Unable Help needed moving to and from a bed to chair (including a wheelchair)?: A Little Help needed walking in hospital room?: A Little Help needed climbing 3-5 steps with a railing? : A Lot 6 Click Score: 13    End of Session Equipment Utilized During Treatment: Gait belt Activity Tolerance: Patient tolerated treatment well Patient left: in chair;with call bell/phone within reach;with family/visitor present Nurse Communication: Mobility status PT Visit Diagnosis: Difficulty in walking, not elsewhere classified (R26.2);Pain Pain - Right/Left: Left Pain - part of body: Leg     Time: 1405-1450 PT Time Calculation (min) (ACUTE ONLY): 45 min  Charges:  $Gait Training: 8-22 mins $Therapeutic Exercise: 8-22 mins $Therapeutic Activity: 8-22 mins                    G Codes:       Michele Petersen, PTA pager [phone]    Michele Petersen 03/20/2017, 3:12 PM

## 2017-03-20 NOTE — Progress Notes (Addendum)
PROGRESS NOTE    Michele Petersen  AVW:[id number] DOB: 1955-08-30 DOA: 03/17/2017 PCP: [facility]  Brief Narrative: [name] a 61 y.o.female,w hypertension, CAD s/p nstemi (? 90% distal LAD, unclear), who presents with mechanical fall down 3-4 stair and has distal femur fracture. S/p closed reduction and retrograde intramedullary nailing. She worked with PT today , PT recommended continued sessions tomorrow.   Assessment & Plan:   Active Problems:   Closed displaced oblique fracture of shaft of left femur (HCC)   Glucose intolerance   CAD (coronary artery disease)   Closed displaced oblique fracture of the left femur:  S/p closed reductin and intramedullary nail placement.  Pain control and PT.    ANEMIA of blood loss:  Hemoglobin around 9.3.  Continue to monitor.     Leukocytosis probably reactive.    Hyperglycemia:  hgba1c is 5.4  Mild thrombocytopenia. Monitor.      DVT prophylaxis: (Lovenox) Code Status: full code.  Family Communication: multiple family members at bedside.  Disposition Plan: home in am.    Consultants:   Orthopedidcs.   Procedures: left femur closed reduction and retrograde intramedullary nailing by Dr Roda Shutters on 9/29 Antimicrobials: none.   Subjective: Spanish speaking, denies any new complaints.   Objective: Vitals:   03/19/17 1507 03/19/17 2207 03/20/17 0541 03/20/17 1334  BP: 122/62 136/70 139/73 129/67  Pulse: 75 65 65 (!) 59  Resp: Temp: 98.5 F (36.9 C) 99 F (37.2 C) 98.9 F (37.2 C) 98.6 F (37 C)  TempSrc: Oral Oral Oral Oral  SpO2: 95% 90% 92% 95%  Weight:      Height:        Intake/Output Summary (Last 24 hours) at 03/20/17 1737 Last data filed at 03/19/17 1800  Gross per 24 hour  Intake              320 ml  Output                0 ml  Net              320 ml   Filed Weights   03/17/17 2303 03/18/17 0345  Weight: 98 kg (216 lb) 99.5 kg (219 lb 4 oz)    Examination:  unchanged from yesterday.  General exam: Appears calm and comfortable sitting in the chair Respiratory system: Clear to auscultation. Respiratory effort normal. Cardiovascular system: S1 & S2 heard, RRR. No JVD, murmurs, rubs, gallops or clicks. No pedal edema. Gastrointestinal system: Abdomen is nondistended, soft and nontender. No organomegaly or masses felt. Normal bowel sounds heard. Central nervous system: Alert and oriented. No focal neurological deficits. Extremities: left lower extremity bandaged and draining bloody drainage.  Skin: No rashes, lesions or ulcers Psychiatry: Judgement and insight appear normal. Mood & affect appropriate.     Data Reviewed: I have personally reviewed following labs and imaging studies  CBC:  Recent Labs Lab 03/18/17 0031 03/19/17 0405 03/20/17 0646 03/20/17 1451  WBC 8.8 13.6* 8.7  --   NEUTROABS 7.2  --   --   --   HGB 11.9* 10.0* 9.3*  --   HCT 37.1 31.5* 29.3*  --   MCV 86.5 86.8 86.7  --   PLT 136* 130* 98* 136*   Basic Metabolic Panel:  Recent Labs Lab 03/18/17 0031 03/19/17 0405 03/20/17 0646  NA 138 138 138  K 3.8 4.0 3.8  CL 107 105 108  CO2 GLUCOSE 148* 145*  108*  BUN CREATININE 1.11* 1.12* 0.88  CALCIUM 8.8* 8.4* 8.1*   GFR: Estimated Creatinine Clearance: 77.9 mL/min (by C-G formula based on SCr of 0.88 mg/dL). Liver Function Tests:  Recent Labs Lab 03/18/17 0031 03/19/17 0405  AST 18 22  ALT 17 14  ALKPHOS 67 45  BILITOT 0.6 0.5  PROT 6.4* 5.2*  ALBUMIN 3.8 3.2*   No results for input(s): LIPASE, AMYLASE in the last 168 hours. No results for input(s): AMMONIA in the last 168 hours. Coagulation Profile:  Recent Labs Lab 03/18/17 0341  INR 1.00   Cardiac Enzymes: No results for input(s): CKTOTAL, CKMB, CKMBINDEX, TROPONINI in the last 168 hours. BNP (last 3 results) No results for input(s): PROBNP in the last 8760 hours. HbA1C:  Recent Labs  03/18/17 0341 03/19/17 1646   HGBA1C 5.6 5.6   CBG: No results for input(s): GLUCAP in the last 168 hours. Lipid Profile: No results for input(s): CHOL, HDL, LDLCALC, TRIG, CHOLHDL, LDLDIRECT in the last 72 hours. Thyroid Function Tests: No results for input(s): TSH, T4TOTAL, FREET4, T3FREE, THYROIDAB in the last 72 hours. Anemia Panel: No results for input(s): VITAMINB12, FOLATE, FERRITIN, TIBC, IRON, RETICCTPCT in the last 72 hours. Sepsis Labs: No results for input(s): PROCALCITON, LATICACIDVEN in the last 168 hours.  Recent Results (from the past 240 hour(s))  Surgical pcr screen     Status: None   Collection Time: 03/18/17  2:55 AM  Result Value Ref Range Status   MRSA, PCR NEGATIVE NEGATIVE Final   Staphylococcus aureus NEGATIVE NEGATIVE Final    Comment: (NOTE) The Xpert SA Assay (FDA approved for NASAL specimens in patients 34 years of age and older), is one component of a comprehensive surveillance program. It is not intended to diagnose infection nor to guide or monitor treatment.          Radiology Studies: No results found.      Scheduled Meds: . aspirin EC  81 mg Oral Daily  . atorvastatin  20 mg Oral q1800  . citalopram  20 mg Oral Daily  . losartan  25 mg Oral Daily  . metoprolol succinate  25 mg Oral Daily   Continuous Infusions: . methocarbamol (ROBAXIN)  IV       LOS: 2 days    Time spent: 25 minutes    Kinsler Soeder, MD [facility] Pager [phone]  If 7PM-7AM, please contact night-coverage [email or web address] Password [facility] 03/20/2017, 5:37 PM

## 2017-03-20 NOTE — Progress Notes (Signed)
Occupational Therapy Treatment Patient Details Name: Michele Petersen MRN: [id number] DOB: April 01, 1956 Today's Date: 03/20/2017    History of present illness 61 y.o.female,w hypertension, CAD s/p nstemi (? 90% distal LAD, unclear), who presents with mechanical fall down 3-4 stair and has distal femur fracture. In ED, xray confirmed distal femur fracture. Underwent  left femur closed reduction and retrograde intramedullary nailing on 03/18/17.   OT comments  Pt progressing towards goals. Completed room level functional mobility at RW level with MinA, though requires intermittent standing rest breaks and verbal cues for maintaining TDWB in LLE as Pt appears to have increased difficulty maintaining with increased fatigue. Education provided and questions answered regarding safety and techniques for ADL completion and functional mobility transfers. Feel Pt will benefit from additional OT tx session prior to return home to further progress Pt's safety and independence with ADLs and functional mobility.    Follow Up Recommendations  No OT follow up;Supervision/Assistance - 24 hour    Equipment Recommendations  3 in 1 bedside commode          Precautions / Restrictions Precautions Precautions: Fall Restrictions Weight Bearing Restrictions: Yes LLE Weight Bearing: Touchdown weight bearing Other Position/Activity Restrictions: Per MD OP note, pt TDWB       Mobility Bed Mobility Overal bed mobility: Needs Assistance Bed Mobility: Supine to Sit     Supine to sit: Mod assist     General bed mobility comments: practicing from flat bed; Pt requires increased time and effort; educated on sequence/technique, requires assist for LLE and to bring trunk upright   Transfers Overall transfer level: Needs assistance Equipment used: Rolling walker (2 wheeled) Transfers: Sit to/from Stand Sit to Stand: Min assist         General transfer comment: verbal cues for hand placement; assist to rise  and steady, assist to control descent     Balance Overall balance assessment: Needs assistance Sitting-balance support: No upper extremity supported;Feet supported Sitting balance-Leahy Scale: Normal     Standing balance support: Bilateral upper extremity supported;During functional activity Standing balance-Leahy Scale: Poor Standing balance comment: RW needed for ambulation                           ADL either performed or assessed with clinical judgement   ADL Overall ADL's : Needs assistance/impaired     Grooming: Set up;Sitting;Supervision/safety               Lower Body Dressing: Moderate assistance;Sit to/from stand Lower Body Dressing Details (indicate cue type and reason): Pt required assist to advance underwear over hips while standing at RW; issued Pt reacher and educated on use of reacher for completing LB dressing with Pt practicing with simulated task, MinA to complete  Toilet Transfer: Minimal assistance;RW;Stand-pivot;BSC           Functional mobility during ADLs: Minimal assistance;Rolling walker General ADL Comments: Pt has very supportive family present during session; attempted ambulating to bathroom using RW however Pt with increased fatigue/difficulty maintaining WB status prior to reaching doorway threshold; educated on use of 3:1 for toileting initially to decrease distance Pt will have to navigate at home to ensure safety during transfers                        Cognition Arousal/Alertness: Awake/alert Behavior During Therapy: [facility] for tasks assessed/performed Overall Cognitive Status: Within Functional Limits for tasks assessed  General Comments Pt's son present during session and translating     Pertinent Vitals/ Pain       Pain Assessment: Faces Pain Score: 8  Faces Pain Scale: Hurts whole lot Pain Location: RLE Pain Descriptors / Indicators:  Sore;Grimacing Pain Intervention(s): Limited activity within patient's tolerance;Monitored during session;Repositioned                                                          Frequency  Min 3X/week        Progress Toward Goals  OT Goals(current goals can now be found in the care plan section)  Progress towards OT goals: Progressing toward goals  Acute Rehab OT Goals Patient Stated Goal: home OT Goal Formulation: With patient Time For Goal Achievement: 04/02/17 Potential to Achieve Goals: Good  Plan Discharge plan remains appropriate                    AM-PAC PT "6 Clicks" Daily Activity     Outcome Measure   Help from another person eating meals?: None Help from another person taking care of personal grooming?: A Little Help from another person toileting, which includes using toliet, bedpan, or urinal?: A Little Help from another person bathing (including washing, rinsing, drying)?: A Lot Help from another person to put on and taking off regular upper body clothing?: None Help from another person to put on and taking off regular lower body clothing?: A Lot 6 Click Score: 18    End of Session Equipment Utilized During Treatment: Gait belt;Rolling walker  OT Visit Diagnosis: Unsteadiness on feet (R26.81);Other abnormalities of gait and mobility (R26.89);Muscle weakness (generalized) (M62.81);Pain Pain - Right/Left: Left Pain - part of body: Leg   Activity Tolerance Patient tolerated treatment well   Patient Left in chair;with call bell/phone within reach;with family/visitor present   Nurse Communication Mobility status        Time: 9604-5409 OT Time Calculation (min): 36 min  Charges: OT General Charges $OT Visit: 1 Visit OT Treatments $Self Care/Home Management : 23-37 mins  Marcy Siren, OT Pager [phone] 03/20/2017    Michele Petersen 03/20/2017, 4:28 PM

## 2017-03-20 NOTE — Progress Notes (Signed)
PT/OT verbalized to RN today that pt needing more therapy and not recommending pt discharge today. Their notes state    OT--- "Feel Pt will benefit from additional OT tx session prior to return home to further progress Pt's safety and independence with ADLs and functional mobility."  PT- Pt performed limited gait and attempted crutch training. Pt is not safe with crutches and will require a RW at d/c to ensure safe transfers and gait training at home.  Pt will still continue to benefit from a WC with elevating leg rests at d/c as she only has a transport chair and would be safe for community distance in a WC.  A WC will also allow her to increase her activity tolerance as she is only able to tolerate short gait distances of ambulation. Plan next session for WC mobility and continued strengthening exercises."   Both are recommending equipment for home use.    Pt states she is not ready for home, needs more therapy as therapist stated she needed. Dr Blake Divine sent a message to notify of the above information. Also no case management here at this time to order and have equipment delivered tonight.    Dr Blake Divine informed and d/c canceled.

## 2017-03-21 DIAGNOSIS — S72332D Displaced oblique fracture of shaft of left femur, subsequent encounter for closed fracture with routine healing: Secondary | ICD-10-CM

## 2017-03-21 LAB — CBC
HEMATOCRIT: 27.2 % — AB (ref 36.0–46.0)
Hemoglobin: 8.7 g/dL — ABNORMAL LOW (ref 12.0–15.0)
MCH: 27.8 pg (ref 26.0–34.0)
MCHC: 32 g/dL (ref 30.0–36.0)
MCV: 86.9 fL (ref 78.0–100.0)
Platelets: 103 10*3/uL — ABNORMAL LOW (ref 150–400)
RBC: 3.13 MIL/uL — AB (ref 3.87–5.11)
RDW: 13.7 % (ref 11.5–15.5)
WBC: 7.9 10*3/uL (ref 4.0–10.5)

## 2017-03-21 NOTE — Discharge Summary (Signed)
Physician Discharge Summary  Michele Petersen ZOX:[id number] DOB: 06-19-1956 DOA: 03/17/2017  PCP: [facility], [facility]  Admit date: 03/17/2017 Discharge date: 03/21/2017  Admitted From: Home.  Disposition:  HOme.   Recommendations for Outpatient Follow-up:  1. Follow up with PCP in 1-2 weeks 2. Please obtain BMP/CBC in one week Please follow up with orthopedics as recommended.   Discharge Condition:stable.  CODE STATUS: full code.  Diet recommendation: Heart Healthy  Brief/Interim Summary: Michele Petersen a 61 y.o.female,w hypertension, CAD s/p nstemi (? 90% distal LAD, unclear), who presents with mechanical fall down 3-4 stair and has distal femur fracture. S/p closed reduction and retrograde intramedullary nailing. She worked with PT today , continue with PT at home.   Discharge Diagnoses:  Active Problems:   Closed displaced oblique fracture of shaft of left femur (HCC)   Glucose intolerance   CAD (coronary artery disease)   Closed displaced oblique fracture of the left femur:  S/p closed reductin and intramedullary nail placement by orthopedics. .  Pain control and PT.  Wheel chair delivered to the house.    ANEMIA of blood loss:  Drop in hemoglobin, from surgery. Recommend outpatient follow up with pcp and check cbc in one week.     Leukocytosis probably reactive.    Hyperglycemia:  hgba1c is 5.4  Mild thrombocytopenia. Monitor.     Discharge Instructions  Discharge Instructions    Diet - low sodium heart healthy    Complete by:  As directed    Discharge instructions    Complete by:  As directed    Please follow up with Dr Roda Shutters as recommended.  Please follow up with PCP in one week and get cbc done.   Touch down weight bearing    Complete by:  As directed      Allergies as of 03/21/2017   No Known Allergies     Medication List    TAKE these medications   atorvastatin 20 MG tablet Commonly known as:  LIPITOR Take 20 mg by mouth  daily at 12 noon.   citalopram 20 MG tablet Commonly known as:  CELEXA Take 10 mg by mouth daily at 12 noon.   enoxaparin 40 MG/0.4ML injection Commonly known as:  LOVENOX Inject 0.4 mLs (40 mg total) into the skin daily.   losartan 100 MG tablet Commonly known as:  COZAAR Take 100 mg by mouth daily at 12 noon.   metoprolol succinate 25 MG 24 hr tablet Commonly known as:  TOPROL-XL Take 25 mg by mouth daily at 12 noon.   oxyCODONE 5 MG immediate release tablet Commonly known as:  Oxy IR/ROXICODONE Take 1-3 tablets (5-15 mg total) by mouth every 4 (four) hours as needed.            Discharge Care Instructions        Start     Ordered   03/18/17 0000  Touch down weight bearing     03/18/17 8119     Follow-up Information    Tarry Kos, MD Follow up in 2 week(s).   Specialty:  Orthopedic Surgery Why:  For suture removal, For wound re-check Contact information: [address] [place] [address] [phone]        [facility], [facility]. Schedule an appointment as soon as possible for a visit in 1 week(s).   Contact information: [facility] [address] [place] [address] [phone]          No Known Allergies  Consultations:  Orthopedics.  Procedures/Studies: Dg Knee 1-2 Views Left  Result Date: 03/18/2017 CLINICAL DATA:  61 y/o  F; status post fall. EXAM: LEFT FEMUR 2 VIEWS; DG HIP (WITH OR WITHOUT PELVIS) 2-3V LEFT; LEFT KNEE - 1-2 VIEW; LEFT TIBIA AND FIBULA - 2 VIEW COMPARISON:  None. FINDINGS: Left hip: There is no evidence of fracture or other focal bone lesions. Soft tissues are unremarkable. Left femur: Acute comminuted oblique fracture of the lower femoral diaphysis with 3 major fracture components. There is 1 shaft's medial displacement of the proximal femur relative to the distal femur. Left knee: Comminuted lower femur fracture as described above. The knee joint is well aligned with mild medial femorotibial  compartment joint space narrowing and small periarticular osteophytes. Left tibia and fibula: There is no evidence of fracture or other focal bone lesions. Soft tissues are unremarkable. Large plantar calcaneal enthesophyte. IMPRESSION: Acute comminuted oblique fracture of the lower femoral diaphysis with 3 major fracture components. One shaft's with medial displacement of proximal femur relative to distal femur. Hip, knee, and ankle joints are maintained. Electronically Signed   By: Mitzi Hansen M.D.   On: 03/18/2017 00:27   Dg Tibia/fibula Left  Result Date: 03/18/2017 CLINICAL DATA:  61 y/o  F; status post fall. EXAM: LEFT FEMUR 2 VIEWS; DG HIP (WITH OR WITHOUT PELVIS) 2-3V LEFT; LEFT KNEE - 1-2 VIEW; LEFT TIBIA AND FIBULA - 2 VIEW COMPARISON:  None. FINDINGS: Left hip: There is no evidence of fracture or other focal bone lesions. Soft tissues are unremarkable. Left femur: Acute comminuted oblique fracture of the lower femoral diaphysis with 3 major fracture components. There is 1 shaft's medial displacement of the proximal femur relative to the distal femur. Left knee: Comminuted lower femur fracture as described above. The knee joint is well aligned with mild medial femorotibial compartment joint space narrowing and small periarticular osteophytes. Left tibia and fibula: There is no evidence of fracture or other focal bone lesions. Soft tissues are unremarkable. Large plantar calcaneal enthesophyte. IMPRESSION: Acute comminuted oblique fracture of the lower femoral diaphysis with 3 major fracture components. One shaft's with medial displacement of proximal femur relative to distal femur. Hip, knee, and ankle joints are maintained. Electronically Signed   By: Mitzi Hansen M.D.   On: 03/18/2017 00:27   Ct Knee Left Wo Contrast  Result Date: 03/18/2017 CLINICAL DATA:  Left femoral fracture after slip and fall down 3-4 stairs. EXAM: CT OF THE LEFT KNEE WITHOUT CONTRAST TECHNIQUE:  Multidetector CT imaging of the knee was performed according to the standard protocol. Multiplanar CT image reconstructions were also generated. COMPARISON:  Left femur and knee radiographs FINDINGS: Bones/Joint/Cartilage An acute, partly included closed, comminuted spiral oblique fracture of the distal left femoral diaphysis is identified with 1 shaft width anterior and lateral displacement of the main distal fracture fragment which includes the femoral condyles. The more cephalad margin of a fracture is not entirely included on this study. No intra-articular extension. Trace suprapatellar joint effusion on the axial images. The anterior margin of the fracture terminates just above the metadiaphysis. No knee joint dislocation. There is osteoarthritic joint space narrowing of the femorotibial compartment with spurring. Ligaments Suboptimally assessed by CT. Muscles and Tendons No intramuscular hemorrhage. Soft tissues The femoral and popliteal arteries appear to be spared osseous involvement. Mild soft tissue induration about the comminuted fractures noted however. No focal soft tissue hematoma. IMPRESSION: 1. An acute, closed, comminuted spiral oblique fracture of the distal left femoral diaphysis is identified with 1 shaft  width anterior and lateral displacement of the main distal fracture fragment which includes the femoral condyles. 2. No intra-articular extension of the fracture. Trace joint effusion. Electronically Signed   By: Tollie Eth M.D.   On: 03/18/2017 02:51   Dg C-arm 1-60 Min  Result Date: 03/18/2017 CLINICAL DATA:  Intramedullary retrograde femoral nail. EXAM: DG C-ARM 61-120 MIN; LEFT FEMUR 2 VIEWS COMPARISON:  CT from earlier the same day FINDINGS: Intramedullary femoral nail placed retrograde, with distal interlocking and proximal interlocking screws. Distal femur fracture with improved alignment from preoperative imaging. No evidence of intraprocedural fracture. IMPRESSION: Fluoroscopy for  femoral fracture fixation. Electronically Signed   By: Marnee Spring M.D.   On: 03/18/2017 11:21   Dg Hip Unilat W Or Wo Pelvis 2-3 Views Left  Result Date: 03/18/2017 CLINICAL DATA:  61 y/o  F; status post fall. EXAM: LEFT FEMUR 2 VIEWS; DG HIP (WITH OR WITHOUT PELVIS) 2-3V LEFT; LEFT KNEE - 1-2 VIEW; LEFT TIBIA AND FIBULA - 2 VIEW COMPARISON:  None. FINDINGS: Left hip: There is no evidence of fracture or other focal bone lesions. Soft tissues are unremarkable. Left femur: Acute comminuted oblique fracture of the lower femoral diaphysis with 3 major fracture components. There is 1 shaft's medial displacement of the proximal femur relative to the distal femur. Left knee: Comminuted lower femur fracture as described above. The knee joint is well aligned with mild medial femorotibial compartment joint space narrowing and small periarticular osteophytes. Left tibia and fibula: There is no evidence of fracture or other focal bone lesions. Soft tissues are unremarkable. Large plantar calcaneal enthesophyte. IMPRESSION: Acute comminuted oblique fracture of the lower femoral diaphysis with 3 major fracture components. One shaft's with medial displacement of proximal femur relative to distal femur. Hip, knee, and ankle joints are maintained. Electronically Signed   By: Mitzi Hansen M.D.   On: 03/18/2017 00:27   Dg Femur Min 2 Views Left  Result Date: 03/18/2017 CLINICAL DATA:  Intramedullary retrograde femoral nail. EXAM: DG C-ARM 61-120 MIN; LEFT FEMUR 2 VIEWS COMPARISON:  CT from earlier the same day FINDINGS: Intramedullary femoral nail placed retrograde, with distal interlocking and proximal interlocking screws. Distal femur fracture with improved alignment from preoperative imaging. No evidence of intraprocedural fracture. IMPRESSION: Fluoroscopy for femoral fracture fixation. Electronically Signed   By: Marnee Spring M.D.   On: 03/18/2017 11:21   Dg Femur Min 2 Views Left  Result Date:  03/18/2017 CLINICAL DATA:  61 y/o  F; status post fall. EXAM: LEFT FEMUR 2 VIEWS; DG HIP (WITH OR WITHOUT PELVIS) 2-3V LEFT; LEFT KNEE - 1-2 VIEW; LEFT TIBIA AND FIBULA - 2 VIEW COMPARISON:  None. FINDINGS: Left hip: There is no evidence of fracture or other focal bone lesions. Soft tissues are unremarkable. Left femur: Acute comminuted oblique fracture of the lower femoral diaphysis with 3 major fracture components. There is 1 shaft's medial displacement of the proximal femur relative to the distal femur. Left knee: Comminuted lower femur fracture as described above. The knee joint is well aligned with mild medial femorotibial compartment joint space narrowing and small periarticular osteophytes. Left tibia and fibula: There is no evidence of fracture or other focal bone lesions. Soft tissues are unremarkable. Large plantar calcaneal enthesophyte. IMPRESSION: Acute comminuted oblique fracture of the lower femoral diaphysis with 3 major fracture components. One shaft's with medial displacement of proximal femur relative to distal femur. Hip, knee, and ankle joints are maintained. Electronically Signed   By: Buzzy Han.D.  On: 03/18/2017 00:27   Dg Femur Port Min 2 Views Left  Result Date: 03/18/2017 CLINICAL DATA:  61 year old female with a history of femur fracture EXAM: LEFT FEMUR PORTABLE 2 VIEWS COMPARISON:  03/17/2017 FINDINGS: Interval surgical changes of open reduction internal fixation of left femur fracture, with retrograde intramedullary rod placement and 2 proximal and 3 distal interlocking screws. Improved alignment at the fracture site. Expected soft tissue swelling and gas in the surgical bed with skin staples present. IMPRESSION: Interval surgical changes of open reduction internal fixation of left femur fracture, as above, with improved alignment at the fracture site. Electronically Signed   By: Gilmer Mor D.O.   On: 03/18/2017 12:02       Subjective:  No pain,no new  complaints.  Discharge Exam: Vitals:   03/21/17 0404 03/21/17 1301  BP: 140/68 (!) 152/79  Pulse: 66 62  Resp: 15 16  Temp: 98.7 F (37.1 C) 98.7 F (37.1 C)  SpO2: 92% 98%   Vitals:   03/20/17 1334 03/20/17 2055 03/21/17 0404 03/21/17 1301  BP: 129/67 132/60 140/68 (!) 152/79  Pulse: (!) 59 64 66 62  Resp:  Temp: 98.6 F (37 C) 98.5 F (36.9 C) 98.7 F (37.1 C) 98.7 F (37.1 C)  TempSrc: Oral Oral Oral Oral  SpO2: 95% 94% 92% 98%  Weight:      Height:        General: Pt is alert, awake, not in acute distress Cardiovascular: RRR, S1/S2 +, no rubs, no gallops Respiratory: CTA bilaterally, no wheezing, no rhonchi Abdominal: Soft, NT, ND, bowel sounds + Extremities: no edema, no cyanosis    The results of significant diagnostics from this hospitalization (including imaging, microbiology, ancillary and laboratory) are listed below for reference.     Microbiology: Recent Results (from the past 240 hour(s))  Surgical pcr screen     Status: None   Collection Time: 03/18/17  2:55 AM  Result Value Ref Range Status   MRSA, PCR NEGATIVE NEGATIVE Final   Staphylococcus aureus NEGATIVE NEGATIVE Final    Comment: (NOTE) The Xpert SA Assay (FDA approved for NASAL specimens in patients 53 years of age and older), is one component of a comprehensive surveillance program. It is not intended to diagnose infection nor to guide or monitor treatment.      Labs: BNP (last 3 results) No results for input(s): BNP in the last 8760 hours. Basic Metabolic Panel:  Recent Labs Lab 03/18/17 0031 03/19/17 0405 03/20/17 0646  NA 138 138 138  K 3.8 4.0 3.8  CL 107 105 108  CO2 GLUCOSE 148* 145* 108*  BUN CREATININE 1.11* 1.12* 0.88  CALCIUM 8.8* 8.4* 8.1*   Liver Function Tests:  Recent Labs Lab 03/18/17 0031 03/19/17 0405  AST 18 22  ALT 17 14  ALKPHOS 67 45  BILITOT 0.6 0.5  PROT 6.4* 5.2*  ALBUMIN 3.8 3.2*   No results for input(s):  LIPASE, AMYLASE in the last 168 hours. No results for input(s): AMMONIA in the last 168 hours. CBC:  Recent Labs Lab 03/18/17 0031 03/19/17 0405 03/20/17 0646 03/20/17 1451 03/21/17 0425  WBC 8.8 13.6* 8.7  --  7.9  NEUTROABS 7.2  --   --   --   --   HGB 11.9* 10.0* 9.3*  --  8.7*  HCT 37.1 31.5* 29.3*  --  27.2*  MCV 86.5 86.8 86.7  --  86.9  PLT 136*  130* 98* 136* 103*   Cardiac Enzymes: No results for input(s): CKTOTAL, CKMB, CKMBINDEX, TROPONINI in the last 168 hours. BNP: Invalid input(s): POCBNP CBG: No results for input(s): GLUCAP in the last 168 hours. D-Dimer No results for input(s): DDIMER in the last 72 hours. Hgb A1c  Recent Labs  03/19/17 1646  HGBA1C 5.6   Lipid Profile No results for input(s): CHOL, HDL, LDLCALC, TRIG, CHOLHDL, LDLDIRECT in the last 72 hours. Thyroid function studies No results for input(s): TSH, T4TOTAL, T3FREE, THYROIDAB in the last 72 hours.  Invalid input(s): FREET3 Anemia work up No results for input(s): VITAMINB12, FOLATE, FERRITIN, TIBC, IRON, RETICCTPCT in the last 72 hours. Urinalysis No results found for: COLORURINE, APPEARANCEUR, LABSPEC, PHURINE, GLUCOSEU, HGBUR, BILIRUBINUR, KETONESUR, PROTEINUR, UROBILINOGEN, NITRITE, LEUKOCYTESUR Sepsis Labs Invalid input(s): PROCALCITONIN,  WBC,  LACTICIDVEN Microbiology Recent Results (from the past 240 hour(s))  Surgical pcr screen     Status: None   Collection Time: 03/18/17  2:55 AM  Result Value Ref Range Status   MRSA, PCR NEGATIVE NEGATIVE Final   Staphylococcus aureus NEGATIVE NEGATIVE Final    Comment: (NOTE) The Xpert SA Assay (FDA approved for NASAL specimens in patients 16 years of age and older), is one component of a comprehensive surveillance program. It is not intended to diagnose infection nor to guide or monitor treatment.      Time coordinating discharge: Over 30 minutes  SIGNED:   Kathlen Mody, MD  [facility] 03/21/2017, 3:29 PM Pager   If  7PM-7AM, please contact night-coverage [email or web address] Password [email or web address]

## 2017-03-21 NOTE — Progress Notes (Signed)
Delivered application for handicap parking placard to room, while in room [facility] delivered RW WC and 3/1

## 2017-03-21 NOTE — Progress Notes (Signed)
Occupational Therapy Treatment Patient Details Name: Michele Petersen MRN: [id number] DOB: October 09, 1955 Today's Date: 03/21/2017    History of present illness 61 y.o.female,w hypertension, CAD s/p nstemi (? 90% distal LAD, unclear), who presents with mechanical fall down 3-4 stair and has distal femur fracture. In ED, xray confirmed distal femur fracture. Underwent  left femur closed reduction and retrograde intramedullary nailing on 03/18/17.   OT comments  Pt completes transfers to and from the toilet, adhering to weightbearing restrictions with min assist.  Discussed need for shower/tub bench if pt wishes to shower at home as they currently have a tub.  Will practice next visit. Pt currently on second floor apartment but is planning to move to first floor, however this will not happen before discharge.  Will continue to follow in acute care.    Follow Up Recommendations  No OT follow up;Supervision/Assistance - 24 hour    Equipment Recommendations  Other (comment) (Pt has 3:1 but will need walker, and family will look to purchase tub bench outside of hospital)    Recommendations for Other Services PT consult    Precautions / Restrictions Precautions Precautions: Fall Restrictions Weight Bearing Restrictions: Yes LLE Weight Bearing: Touchdown weight bearing       Mobility Bed Mobility                  Transfers Overall transfer level: Needs assistance Equipment used: Rolling walker (2 wheeled) Transfers: Sit to/from Stand Sit to Stand: Min assist         General transfer comment: Min instructional cueing for hand placement with sit to stand as well as for stand to sit.     Balance Overall balance assessment: Needs assistance   Sitting balance-Leahy Scale: Normal     Standing balance support: Bilateral upper extremity supported;During functional activity Standing balance-Leahy Scale: Poor Standing balance comment: Pt needs UE support for mobility secondary to  weightbearing precautions.                            ADL either performed or assessed with clinical judgement   ADL Overall ADL's : Needs assistance/impaired                     Lower Body Dressing: Minimal assistance;Sit to/from stand;With adaptive equipment   Toilet Transfer: Minimal assistance;BSC;Ambulation;RW   Toileting- Clothing Manipulation and Hygiene: Minimal assistance;Sit to/from stand       Functional mobility during ADLs: Minimal assistance;Rolling walker General ADL Comments: Pt needed min instructional cueing for hand placement with sit to stand, as well as for sequencing mobility using the RW.  She was able to maintain TDWBing thru the LLE with transitions and mobility.  Educated pt and family on need for tub/shower bench as well as AE (reacher, sockaide, LH sponge, and reacher).  Will attempt shower/tub transfer next visit.                Cognition Arousal/Alertness: Awake/alert Behavior During Therapy: WFL for tasks assessed/performed Overall Cognitive Status: Within Functional Limits for tasks assessed                                                     Pertinent Vitals/ Pain       Pain Assessment: 0-10 Pain Score: 7  Pain Location:  left knee Pain Descriptors / Indicators: Aching;Discomfort Pain Intervention(s): Monitored during session;Limited activity within patient's tolerance  Home Living                                              Frequency  Min 3X/week        Progress Toward Goals  OT Goals(current goals can now be found in the care plan section)  Progress towards OT goals: Progressing toward goals     Plan Discharge plan remains appropriate       AM-PAC PT "6 Clicks" Daily Activity     Outcome Measure   Help from another person eating meals?: None Help from another person taking care of personal grooming?: A Little Help from another person toileting, which includes  using toliet, bedpan, or urinal?: A Little Help from another person bathing (including washing, rinsing, drying)?: A Little Help from another person to put on and taking off regular upper body clothing?: None Help from another person to put on and taking off regular lower body clothing?: A Little 6 Click Score: 20    End of Session Equipment Utilized During Treatment: Gait belt;Rolling walker  OT Visit Diagnosis: Unsteadiness on feet (R26.81);Other abnormalities of gait and mobility (R26.89);Muscle weakness (generalized) (M62.81);Pain Pain - Right/Left: Left Pain - part of body: Leg   Activity Tolerance Patient tolerated treatment well   Patient Left in chair;with call bell/phone within reach;with family/visitor present   Nurse Communication Mobility status        Time: 1610-9604 OT Time Calculation (min): 41 min  Charges: OT General Charges $OT Visit: 1 Visit OT Treatments $Self Care/Home Management : 38-52 mins     Elynor Kallenberger OTR/L 03/21/2017, 11:55 AM

## 2017-03-21 NOTE — Progress Notes (Signed)
Discharge instructions, RX's and follow up appts explained and provided to patient and son verbalized understanding. Reviewed how to administer lovenox with family verbalized understanding. DME delivered to room and sent home with patient . Patient left floor via wheelchair accompanied by staff no c/o pain or shortness of breath at d/c.  Michele Petersen

## 2017-03-21 NOTE — Care Management Note (Signed)
Case Management Note  Patient Details  Name: Michele Petersen MRN: [id number] Date of Birth: Jul 27, 1955  Subjective/Objective:  61 yr old female s/p IM Nailing of left femur fracture.                 Action/Plan:  Case manager reuested wheelchair, and rolling walker for patient, she has a 3in1 at home. She will have family support at discharge.   Expected Discharge Date:  03/21/17               Expected Discharge Plan:  Home/Self Care  In-House Referral:  NA  Discharge planning Services  CM Consult  Post Acute Care Choice:  Durable Medical Equipment Choice offered to:  NA  DME Arranged:  Walker rolling, Wheelchair manual DME Agency:  [facility] NA  HH Arranged:  NA HH Agency:  NA  Status of Service:  Completed, signed off  If discussed at [employer] of Stay Meetings, dates discussed:    Additional Comments:  Durenda Guthrie, RN 03/21/2017, 4:43 PM

## 2017-03-21 NOTE — Progress Notes (Signed)
    Durable Medical Equipment        Start     Ordered   03/21/17 1551  For home use only DME Walker rolling  Once    Question:  Patient needs a walker to treat with the following condition  Answer:  Other fracture of left femur, initial encounter for closed fracture (HCC)   03/21/17 1555   03/21/17 1549  For home use only DME lightweight manual wheelchair with seat cushion  Once    Comments:  Patient suffers from left femur fracture which impairs their ability to perform daily activities like bathing, dressing, feeding, grooming and toileting in the home.  A walker will not resolve  issue with performing activities of daily living including negotiation of stairs. A wheelchair will allow patient to safely perform daily activities and for family to assist patient safely into apartment. Patient is not able to propel themselves in the home using a standard weight wheelchair due to general weakness. Patient can self propel in the lightweight wheelchair.  Accessories: elevating leg rests (ELRs), wheel locks, extensions and anti-tippers.   03/21/17 1555  Joycelyn Rua, PTA pager [phone]

## 2017-03-21 NOTE — Progress Notes (Signed)
Physical Therapy Treatment Patient Details Name: Michele Petersen MRN: [id number] DOB: [birth year] Today's Date: 03/21/2017    History of Present Illness 61 y.o.female,w hypertension, CAD s/p nstemi (? 90% distal LAD, unclear), who presents with mechanical fall down 3-4 stair and has distal femur fracture. In ED, xray confirmed distal femur fracture. Underwent  left femur closed reduction and retrograde intramedullary nailing on 03/18/17.    PT Comments    Pt performed transfers and short gait distances during session as she continues to fatigue with mobility.  Pt's daughter reports that she will now need to negotiate 16 stairs to enter her second floor apt.  PTA performed stair training and patient only able to negotiate 3 stairs with mod assistance before becoming fatigue.  Pt will require WC with elevating leg rest to improve tolerance to community distances and better maintain her weight bearing restriction.  Pt will require RW for transfer to and from the [facility].  Informed case manager of need for these DME devices for safe transition home.  Pt issued HEP and handout on stair negotiation with WC.  Plan next session for continued problem solving for safe entry into home.    Follow Up Recommendations  No PT follow up;Supervision for mobility/OOB (Recommend outpatient PT when weight bearing restriction is lifted.  )     Equipment Recommendations  Rolling walker with 5" wheels;Wheelchair cushion (measurements PT);Wheelchair (measurements PT) (RN entered orders.)    Recommendations for Other Services       Precautions / Restrictions Precautions Precautions: Fall Restrictions Weight Bearing Restrictions: Yes LLE Weight Bearing: Touchdown weight bearing Other Position/Activity Restrictions: Per MD OP note, pt TDWB    Mobility  Bed Mobility Overal bed mobility: Needs Assistance Bed Mobility: Supine to Sit     Supine to sit: Min assist     General bed mobility comments: Pt required  assistance to advance B LEs to edge of bed.    Transfers Overall transfer level: Needs assistance Equipment used: Rolling walker (2 wheeled) Transfers: Sit to/from Stand Sit to Stand: Min assist         General transfer comment: Min instructional cueing for hand placement with sit to stand as well as for stand to sit. Repeated cueing for correct hand placement.    Ambulation/Gait Ambulation/Gait assistance: Min assist Ambulation Distance (Feet): 6 Feet (+ 10 ft x2 trials.  ) Assistive device: Rolling walker (2 wheeled) Gait Pattern/deviations: Step-to pattern;Decreased stride length;Shuffle;Trunk flexed;Antalgic Gait velocity: decreased Gait velocity interpretation: Below normal speed for age/gender General Gait Details: Pt performed steps from bed to recliner chair. additonal trials performed of 10 ft x2 to and from stair case for stair training.     Stairs Stairs: Yes (Pt with visitor in room who reports they will not move into level apartment in time for d/c and patient will have 16 stairs to negotiate entry into home.  )  Mod assistance Stair Management: One rail Left;Backwards (Pt bumping up stairs on her bottom as she is unable to negotiate in standing safely due to decreased strength.  ) Number of Stairs: 3 General stair comments: Pt required cues for placement of her bottom R foot and B UEs while PTA supported LLE.  Pt required cues for technique to use her UEs to push her bottom to the next step up.  Pt sat down onto the second stair and then negotiated 3 stairs in total and became fatigue.  Pt then required mod assist when pulling on L railing into standing.  PTA  educated patient on other techniques including WC negotiation ( english handout issued to family), and possible ambulance transport.    Wheelchair Mobility    Modified Rankin (Stroke Patients Only)       Balance Overall balance assessment: Needs assistance Sitting-balance support: No upper extremity  supported;Feet supported Sitting balance-Leahy Scale: Normal       Standing balance-Leahy Scale: Poor Standing balance comment: Pt needs UE support for mobility secondary to weightbearing precautions.                             Cognition Arousal/Alertness: Awake/alert Behavior During Therapy: WFL for tasks assessed/performed Overall Cognitive Status: Within Functional Limits for tasks assessed                                        Exercises Total Joint Exercises Ankle Circles/Pumps: AROM;Both;20 reps;Supine Quad Sets: AROM;Left;10 reps;Supine Short Arc Quad: AROM;Left;10 reps;Supine Heel Slides: AAROM;Left;10 reps;Supine Hip ABduction/ADduction: AAROM;Left;10 reps;Supine Straight Leg Raises: AROM;Left;10 reps;Supine    General Comments        Pertinent Vitals/Pain Pain Assessment: 0-10 Pain Score: 6  Pain Location: left knee Pain Descriptors / Indicators: Aching;Discomfort Pain Intervention(s): Monitored during session;Repositioned    Home Living                      Prior Function            PT Goals (current goals can now be found in the care plan section) Acute Rehab PT Goals Patient Stated Goal: home Potential to Achieve Goals: Good Progress towards PT goals: Progressing toward goals    Frequency    Min 5X/week      PT Plan Current plan remains appropriate    Co-evaluation              AM-PAC PT "6 Clicks" Daily Activity  Outcome Measure  Difficulty turning over in bed (including adjusting bedclothes, sheets and blankets)?: A Little Difficulty moving from lying on back to sitting on the side of the bed? : Unable Difficulty sitting down on and standing up from a chair with arms (e.g., wheelchair, bedside commode, etc,.)?: Unable Help needed moving to and from a bed to chair (including a wheelchair)?: A Little Help needed walking in hospital room?: A Little Help needed climbing 3-5 steps with a railing?  : A Lot 6 Click Score: 13    End of Session Equipment Utilized During Treatment: Gait belt Activity Tolerance: Patient tolerated treatment well Patient left: in chair;with call bell/phone within reach;with family/visitor present Nurse Communication: Mobility status PT Visit Diagnosis: Difficulty in walking, not elsewhere classified (R26.2);Pain Pain - Right/Left: Left Pain - part of body: Leg     Time: 1610-9604 PT Time Calculation (min) (ACUTE ONLY): 36 min  Charges:  $Gait Training: 8-22 mins $Therapeutic Exercise: 8-22 mins                    G Codes:       Michele Petersen, PTA pager [phone]    Michele Petersen 03/21/2017, 4:16 PM

## 2017-03-24 ENCOUNTER — Encounter (HOSPITAL_COMMUNITY): Payer: Self-pay | Admitting: Orthopaedic Surgery

## 2017-03-24 NOTE — Addendum Note (Signed)
Addendum  created 03/24/17 0944 by Cecile Hearing, MD   Anesthesia Event edited, Anesthesia Staff edited

## 2017-04-03 ENCOUNTER — Inpatient Hospital Stay (INDEPENDENT_AMBULATORY_CARE_PROVIDER_SITE_OTHER): Payer: Self-pay | Admitting: Orthopaedic Surgery

## 2017-04-06 ENCOUNTER — Encounter (INDEPENDENT_AMBULATORY_CARE_PROVIDER_SITE_OTHER): Payer: Self-pay | Admitting: Orthopaedic Surgery

## 2017-04-06 ENCOUNTER — Ambulatory Visit (INDEPENDENT_AMBULATORY_CARE_PROVIDER_SITE_OTHER): Payer: Self-pay | Admitting: Orthopaedic Surgery

## 2017-04-06 ENCOUNTER — Ambulatory Visit (INDEPENDENT_AMBULATORY_CARE_PROVIDER_SITE_OTHER): Payer: Self-pay

## 2017-04-06 DIAGNOSIS — S72332D Displaced oblique fracture of shaft of left femur, subsequent encounter for closed fracture with routine healing: Secondary | ICD-10-CM

## 2017-04-06 MED ORDER — HYDROCODONE-ACETAMINOPHEN 7.5-325 MG PO TABS: 1.0000 | ORAL_TABLET | Freq: Two times a day (BID) | ORAL | 0 refills | Status: AC | PRN

## 2017-04-06 NOTE — Progress Notes (Signed)
Patient is 2 weeks status post retrograde nailing of the left femur fracture. She is currently living at home. She is touchdown weightbearing. Pain is overall well controlled with pain medicines. Denies any real complaints.  Physical exam shows well-healed surgical incisions without any signs of infection. She does have bruising of the lower extremity which is expected. X-ray show stable alignment and fixation of the fracture.  Patient is doing well from my standpoint. Continue touchdown weightbearing for 4 more weeks. Home health physical therapy referral was faxed in today. Follow-up in 4 weeks with repeat 2 view x-rays of left femur.

## 2017-04-07 ENCOUNTER — Telehealth (INDEPENDENT_AMBULATORY_CARE_PROVIDER_SITE_OTHER): Payer: Self-pay | Admitting: Orthopaedic Surgery

## 2017-04-07 NOTE — Telephone Encounter (Signed)
Michele Petersen (PT) with [name] called advised they can not see the patient because they are full. The number to contact [name] is [phone]

## 2017-04-10 NOTE — Telephone Encounter (Signed)
I have never encountered this issue before.  Let them know that they need to see the patient as soon as possible

## 2017-04-10 NOTE — Telephone Encounter (Signed)
Tried to call no answer. Will try again later.  

## 2017-04-10 NOTE — Telephone Encounter (Signed)
See message below.  What would you like for me to do next?

## 2017-04-11 NOTE — Telephone Encounter (Signed)
Per Dr Roda ShuttersXu they would like to know if they can at least go out once to assess her and give her some exercises to do on her own

## 2017-04-11 NOTE — Telephone Encounter (Signed)
Tried to call [name] to ask her if they can at least see patient once and give her exercises to do on her own No answer LMOM to return call

## 2017-04-11 NOTE — Telephone Encounter (Signed)
Nothing we can do.  Are there any other agencies we can try?

## 2017-04-11 NOTE — Telephone Encounter (Signed)
Called [name] and she [place]'t [place].

## 2017-04-11 NOTE — Telephone Encounter (Signed)
Called [facility] [phone] and unfortunately they said they cannot go out not even 1 time they do not have enough staff. Tried to explain to them patient had SU and needed to be seen but they said NO!Marland Kitchen.   What would you like for me to do now?

## 2017-04-12 NOTE — Telephone Encounter (Signed)
You could try [facility], [facility], [facility], [facility], [facility].  I just googled for HHPT [place].  You will have to call to see if they do self pay.  That is crazy that [facility] not [facility], and see her as soon as they can. I did call [name] and leave another message for her inquiring about this one.

## 2017-04-12 NOTE — Telephone Encounter (Signed)
Called [facility] and they accept self pay patients faxed order to them with demo sheet and op note

## 2017-04-12 NOTE — Telephone Encounter (Signed)
Do you know of any other agencies for self pay ppl  For HHPT that will see patient. [facility] could not see her,  not enough staff [place].

## 2017-04-12 NOTE — Telephone Encounter (Signed)
[facility] does not see self pay patients. Will try another agency.

## 2017-04-12 NOTE — Telephone Encounter (Signed)
[facility] Faxed number [phone]

## 2017-05-08 ENCOUNTER — Ambulatory Visit (INDEPENDENT_AMBULATORY_CARE_PROVIDER_SITE_OTHER): Payer: Self-pay | Admitting: Orthopaedic Surgery

## 2017-05-08 ENCOUNTER — Ambulatory Visit (INDEPENDENT_AMBULATORY_CARE_PROVIDER_SITE_OTHER): Payer: Self-pay

## 2017-05-08 ENCOUNTER — Encounter (INDEPENDENT_AMBULATORY_CARE_PROVIDER_SITE_OTHER): Payer: Self-pay | Admitting: Orthopaedic Surgery

## 2017-05-08 DIAGNOSIS — S72332D Displaced oblique fracture of shaft of left femur, subsequent encounter for closed fracture with routine healing: Secondary | ICD-10-CM

## 2017-05-08 NOTE — Progress Notes (Signed)
Patient is 6 weeks status post retrograde femoral nailing.  She is overall doing well ambulate with a walker.  She has not done any home health physical therapy due to lack of available physical therapist [place] [facility] [place].  She is doing home exercises.  Overall she is improving.  Surgical scars are fully healed.  She does have persistent swelling in her leg.  Calf is nontender.  No chest pain or shortness of breath.  Foot is warm well perfused without neurovascular compromise.  X-rays show stable fixation with evidence of callus formation and bony consolidation.  I have given her a referral to outpatient physical therapy.  She may weight-bear as tolerated with walker.  Continue work with gait training strengthening and balance.  Follow-up in 6 weeks with repeat 2 view x-rays of the left femur.

## 2017-06-19 ENCOUNTER — Ambulatory Visit (INDEPENDENT_AMBULATORY_CARE_PROVIDER_SITE_OTHER): Payer: Self-pay

## 2017-06-19 ENCOUNTER — Ambulatory Visit (INDEPENDENT_AMBULATORY_CARE_PROVIDER_SITE_OTHER): Payer: Self-pay | Admitting: Orthopaedic Surgery

## 2017-06-19 ENCOUNTER — Encounter (INDEPENDENT_AMBULATORY_CARE_PROVIDER_SITE_OTHER): Payer: Self-pay | Admitting: Orthopaedic Surgery

## 2017-06-19 DIAGNOSIS — S72332D Displaced oblique fracture of shaft of left femur, subsequent encounter for closed fracture with routine healing: Secondary | ICD-10-CM

## 2017-06-19 NOTE — Progress Notes (Signed)
Patient ID: Michele HugeLuisa Petersen, female   DOB: Oct 07, 1955, 61 y.o.   MRN: [id number]  Patient is 3 months status post intramedullary nailing of left femur fracture.  She is improving overall.  Her pain is improving.  She has some mild discomfort with prolonged standing or walking.  Her surgical scars are fully healed.  There is no signs of infection or any significant swelling.  X-rays show stable fixation and evidence of healing.  At this point she may return to work with restrictions.  No prolonged standing or walking.  Follow-up in 2 months for repeat 2 view x-rays of the left femur.  Questions encouraged and answered.

## 2017-08-17 ENCOUNTER — Ambulatory Visit (INDEPENDENT_AMBULATORY_CARE_PROVIDER_SITE_OTHER): Payer: Self-pay | Admitting: Orthopaedic Surgery

## 2017-08-17 ENCOUNTER — Encounter (INDEPENDENT_AMBULATORY_CARE_PROVIDER_SITE_OTHER): Payer: Self-pay | Admitting: Orthopaedic Surgery

## 2017-08-17 ENCOUNTER — Ambulatory Visit (INDEPENDENT_AMBULATORY_CARE_PROVIDER_SITE_OTHER): Payer: Self-pay

## 2017-08-17 DIAGNOSIS — S72332D Displaced oblique fracture of shaft of left femur, subsequent encounter for closed fracture with routine healing: Secondary | ICD-10-CM

## 2017-08-17 NOTE — Progress Notes (Signed)
Office Visit Note   Patient: Michele Petersen           Date of Birth: [birth year]           MRN: [id number] Visit Date: 08/17/2017              Requested by: Health, [facility] [place], [place] [id number] PCP: Health, [facility]   Assessment & Plan: Visit Diagnoses:  1. Closed displaced oblique fracture of shaft of left femur with routine healing     Plan: Impression is 5 months status post intramedullary fixation of left femur fracture overall she is improving.  X-rays demonstrate progressive healing.  At this point continue to increase activity as tolerated.  Follow-up in 6 months with 2 view x-rays of the left femur.  We did discuss that if the distal interlocking screws continue to bother her we can consider removal at a year at the earliest.  Follow-Up Instructions: Return in about 6 months (around 02/14/2018).   Orders:  Orders Placed This Encounter  Procedures  . XR FEMUR MIN 2 VIEWS LEFT   No orders of the defined types were placed in this encounter.     Procedures: No procedures performed   Clinical Data: No additional findings.   Subjective: Chief Complaint  Patient presents with  . Left Knee - Pain, Follow-up    Patient is a 62 year old female who is 5 months status post retrograde femoral nailing for subcondylar femur fracture.  She is overall doing well.  She is working part-time.  She does complain of some lateral knee pain which feels like mechanical irritation.  She does endorse some mild swelling of the lower extremity.    Review of Systems  Constitutional: Negative.   HENT: Negative.   Eyes: Negative.   Respiratory: Negative.   Cardiovascular: Negative.   Endocrine: Negative.   Musculoskeletal: Negative.   Neurological: Negative.   Hematological: Negative.   Psychiatric/Behavioral: Negative.   All other systems reviewed and are negative.    Objective: Vital Signs: There were no vitals taken for this  visit.  Physical Exam  Constitutional: She is oriented to person, place, and time. She appears well-developed and well-nourished.  Pulmonary/Chest: Effort normal.  Neurological: She is alert and oriented to person, place, and time.  Skin: Skin is warm. Capillary refill takes less than 2 seconds.  Psychiatric: She has a normal mood and affect. Her behavior is normal. Judgment and thought content normal.  Nursing note and vitals reviewed.   Ortho Exam Left lower extremity exam she has well-healed surgical scars.  She has good range of motion of the knee she does have some irritation laterally over the distal interlocking screws.  Calf is nontender. Specialty Comments:  No specialty comments available.  Imaging: Xr Femur Min 2 Views Left  Result Date: 08/17/2017 Significant evidence of increased healing.  Stable fixation.    PMFS History: Patient Active Problem List   Diagnosis Date Noted  . Closed displaced oblique fracture of shaft of left femur (HCC) 03/18/2017  . Glucose intolerance 03/18/2017  . CAD (coronary artery disease) 03/18/2017   Past Medical History:  Diagnosis Date  . Glucose intolerance   . Hypertension   . Myocardial infarction [facility](HCC)     History reviewed. No pertinent family history.  Past Surgical History:  Procedure Laterality Date  . CESAREAN SECTION    . FEMUR IM NAIL Left 03/18/2017   Procedure: INTRAMEDULLARY (IM) RETROGRADE FEMORAL NAILING;  Surgeon: Gershon MusselXu, Alyze Lauf  M, MD;  Location: [facility] OR;  Service: Orthopedics;  Laterality: Left;   Social History   Occupational History  . Not on file  Tobacco Use  . Smoking status: Never Smoker  . Smokeless tobacco: Never Used  Substance and Sexual Activity  . Alcohol use: No  . Drug use: Not on file  . Sexual activity: Not on file

## 2017-12-04 ENCOUNTER — Telehealth (INDEPENDENT_AMBULATORY_CARE_PROVIDER_SITE_OTHER): Payer: Self-pay | Admitting: Orthopaedic Surgery

## 2017-12-04 NOTE — Telephone Encounter (Signed)
Patients granddaughter Annice PihJackie called to see if patient could have her handicap placard renewed she said it runs out soon. Please call Annice PihJackie back # [phone]

## 2017-12-04 NOTE — Telephone Encounter (Signed)
12 months

## 2017-12-04 NOTE — Telephone Encounter (Signed)
Please advise. If yes, renew for how many months?

## 2017-12-04 NOTE — Telephone Encounter (Signed)
IC patient to advise will be ready for pickup Tues 12/05/17, NA, cannot leave a message

## 2017-12-07 ENCOUNTER — Telehealth (INDEPENDENT_AMBULATORY_CARE_PROVIDER_SITE_OTHER): Payer: Self-pay | Admitting: *Deleted

## 2017-12-08 ENCOUNTER — Telehealth (INDEPENDENT_AMBULATORY_CARE_PROVIDER_SITE_OTHER): Payer: Self-pay

## 2017-12-08 NOTE — Telephone Encounter (Signed)
Called patient & she would like new handicap. Her's will expire at the end of June. See other message.

## 2017-12-08 NOTE — Telephone Encounter (Signed)
Patients handicap will expire at the end of June, would like to get it renewed.  If so for how long?

## 2017-12-08 NOTE — Telephone Encounter (Signed)
3 months

## 2017-12-11 NOTE — Telephone Encounter (Signed)
Called patient back. She is aware.

## 2017-12-11 NOTE — Telephone Encounter (Signed)
Ready for pick up. Called patient no answer. Could not leave voicemail.

## 2018-02-15 ENCOUNTER — Ambulatory Visit (INDEPENDENT_AMBULATORY_CARE_PROVIDER_SITE_OTHER): Payer: Self-pay

## 2018-02-15 ENCOUNTER — Encounter (INDEPENDENT_AMBULATORY_CARE_PROVIDER_SITE_OTHER): Payer: Self-pay | Admitting: Orthopaedic Surgery

## 2018-02-15 ENCOUNTER — Ambulatory Visit (INDEPENDENT_AMBULATORY_CARE_PROVIDER_SITE_OTHER): Payer: Self-pay | Admitting: Orthopaedic Surgery

## 2018-02-15 DIAGNOSIS — S72332D Displaced oblique fracture of shaft of left femur, subsequent encounter for closed fracture with routine healing: Secondary | ICD-10-CM

## 2018-02-15 DIAGNOSIS — M1712 Unilateral primary osteoarthritis, left knee: Secondary | ICD-10-CM

## 2018-02-15 MED ORDER — LIDOCAINE HCL 1 % IJ SOLN
2.0000 mL | INTRAMUSCULAR | Status: AC | PRN
  Administered 2018-02-15: 2 mL

## 2018-02-15 MED ORDER — METHYLPREDNISOLONE ACETATE 40 MG/ML IJ SUSP
40.0000 mg | INTRAMUSCULAR | Status: AC | PRN
  Administered 2018-02-15: 40 mg via INTRA_ARTICULAR

## 2018-02-15 MED ORDER — BUPIVACAINE HCL 0.5 % IJ SOLN
2.0000 mL | INTRAMUSCULAR | Status: AC | PRN
  Administered 2018-02-15: 2 mL via INTRA_ARTICULAR

## 2018-02-15 NOTE — Progress Notes (Signed)
Office Visit Note   Patient: Michele Petersen           Date of Birth: 05-06-1956           MRN: [id number] Visit Date: 02/15/2018              Requested by: Health, [facility] [place], [place] [id number] PCP: Health, [facility]   Assessment & Plan: Visit Diagnoses:  1. 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  Today's encounter was performed through the interpreter.  From a fracture standpoint this has completely healed and I think her symptoms are now due to her advanced arthritis.  We discussed treatment options and the risks and benefits.  We discussed cortisone injection today which she agreed to.  This was performed today without complication.  We briefly discussed total knee replacement and the details of the rehab and recovery and the need to remove the femoral nail in order to accomplish this.  Follow-up as needed.  Follow-Up Instructions: Return if symptoms worsen or fail to improve.   Orders:  Orders Placed This Encounter  Procedures  . XR FEMUR MIN 2 VIEWS LEFT   No orders of the defined types were placed in this encounter.     Procedures: Large Joint Inj: L knee on 02/15/2018 8:49 AM Details: 22 G needle Medications: 2 mL bupivacaine 0.5 %; 2 mL lidocaine 1 %; 40 mg methylPREDNISolone acetate 40 MG/ML Outcome: tolerated well, no immediate complications Patient was prepped and draped in the usual sterile fashion.       Clinical Data: No additional findings.   Subjective: Chief Complaint  Patient presents with  . Left Hip - Pain, Follow-up    Patient 11 months status post retrograde femoral nail fixation of a femur fracture.  She is currently ambulating without any assistive devices.  She reports 10 out of 10 pain in her left knee.  The pain is  throughout her left knee.  Denies any swelling or constitutional symptoms.   Review of Systems  Constitutional: Negative.   HENT: Negative.   Eyes: Negative.   Respiratory: Negative.   Cardiovascular: Negative.   Endocrine: Negative.   Musculoskeletal: Negative.   Neurological: Negative.   Hematological: Negative.   Psychiatric/Behavioral: Negative.   All other systems reviewed and are negative.    Objective: Vital Signs: There were no vitals taken for this visit.  Physical Exam  Constitutional: She is oriented to person, place, and time. She appears well-developed and well-nourished.  Pulmonary/Chest: Effort normal.  Neurological: She is alert and oriented to person, place, and time.  Skin: Skin is warm. Capillary refill takes less than 2 seconds.  Psychiatric: She has a normal mood and affect. Her behavior is normal. Judgment and thought content normal.  Nursing note and vitals reviewed.   Ortho Exam Left lower extremity exam shows fully healed surgical scars.  There is no swelling.  No joint effusion.  Minimal patellofemoral crepitus.  Collaterals and cruciates are stable. Specialty Comments:  No specialty comments available.  Imaging: Xr Femur Min 2 Views Left  Result Date: 02/15/2018 Fully healed left femur fracture.  No hardware complications.    PMFS History: Patient Active Problem List   Diagnosis Date Noted  . Closed displaced oblique fracture of shaft of left femur (HCC) 03/18/2017  . Glucose intolerance 03/18/2017  .  CAD (coronary artery disease) 03/18/2017   Past Medical History:  Diagnosis Date  . Glucose intolerance   . Hypertension   . Myocardial infarction [facility])     History reviewed. No pertinent family history.  Past Surgical History:  Procedure Laterality Date  . CESAREAN SECTION    . FEMUR IM NAIL Left 03/18/2017   Procedure: INTRAMEDULLARY (IM) RETROGRADE FEMORAL NAILING;  Surgeon: Tarry Kos, MD;  Location: [facility] OR;  Service:  Orthopedics;  Laterality: Left;   Social History   Occupational History  . Not on file  Tobacco Use  . Smoking status: Never Smoker  . Smokeless tobacco: Never Used  Substance and Sexual Activity  . Alcohol use: No  . Drug use: Not on file  . Sexual activity: Not on file

## 2018-07-23 IMAGING — CT CT KNEE*L* W/O CM
3 series · 13 of 33 positions shown, 16 images · non-contrast
Comparison: Left femur and knee radiographs

CLINICAL DATA: Left femoral fracture after slip and fall down 3-4
stairs.

EXAM:
CT OF THE LEFT KNEE WITHOUT CONTRAST
TECHNIQUE: Multidetector CT imaging of the knee was performed according to the
standard protocol. Multiplanar CT image reconstructions were also
generated.

[Series 4: lower ext 1.5 st · axial · 0.37mm/px · z∈[+424,+588]mm · 5 of 160 slices shown, 7 images]
[im 25/160  soft-tissue]
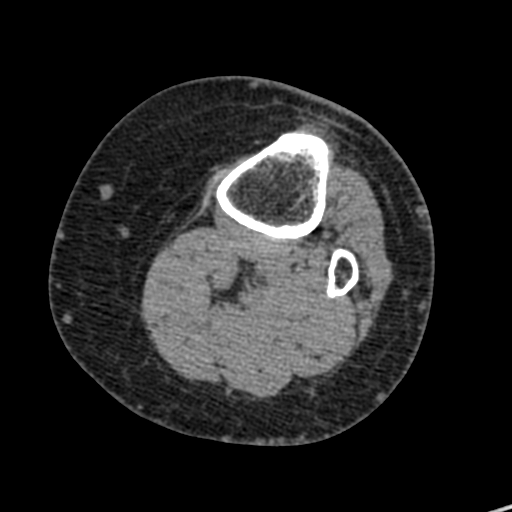
[im 25/160  bone]
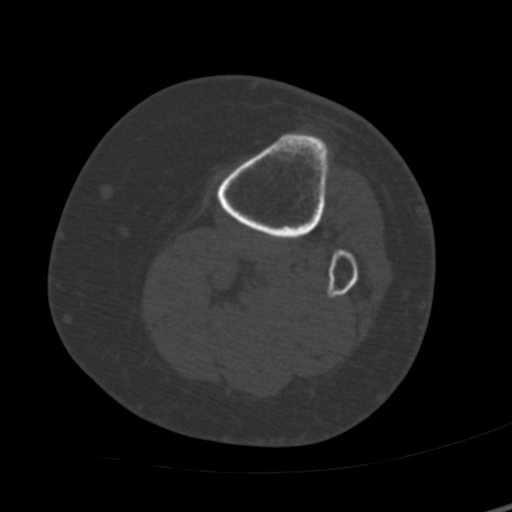
[im 49/160  bone]
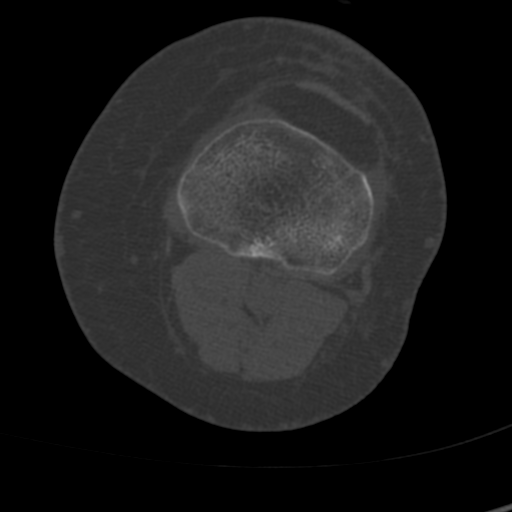
[im 86/160  bone]
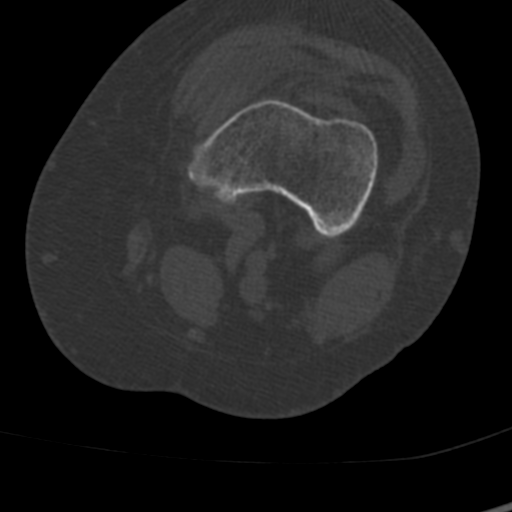
[im 111/160  bone]
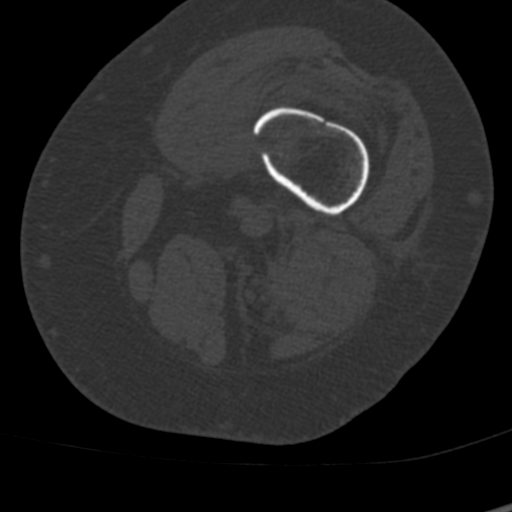
[im 135/160  soft-tissue]
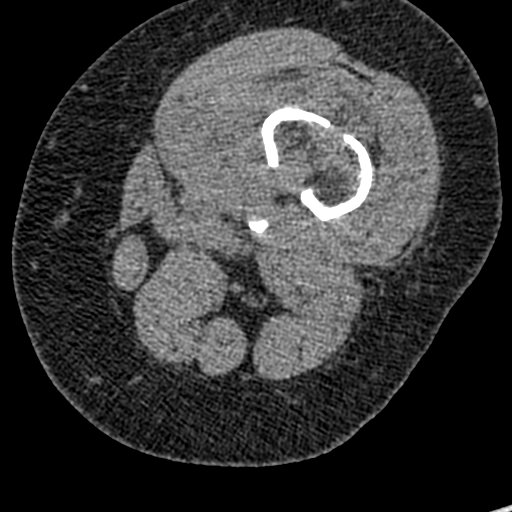
[im 135/160  bone]
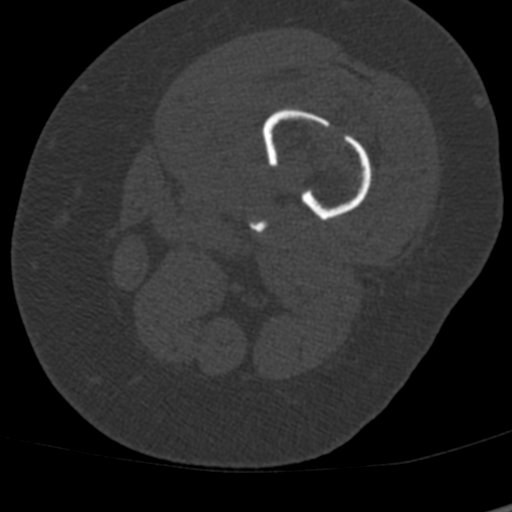

[Series 9: lower ext cor st · coronal · 0.33mm/px · 3 of 105 slices shown]
[im 21/105  bone]
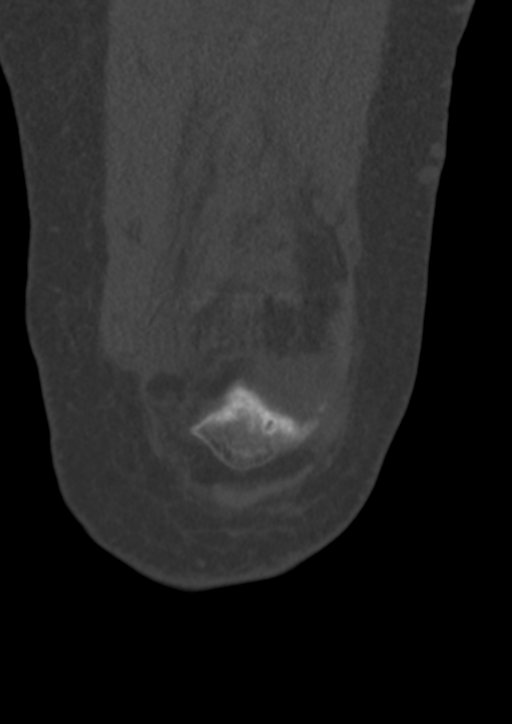
[im 42/105  bone]
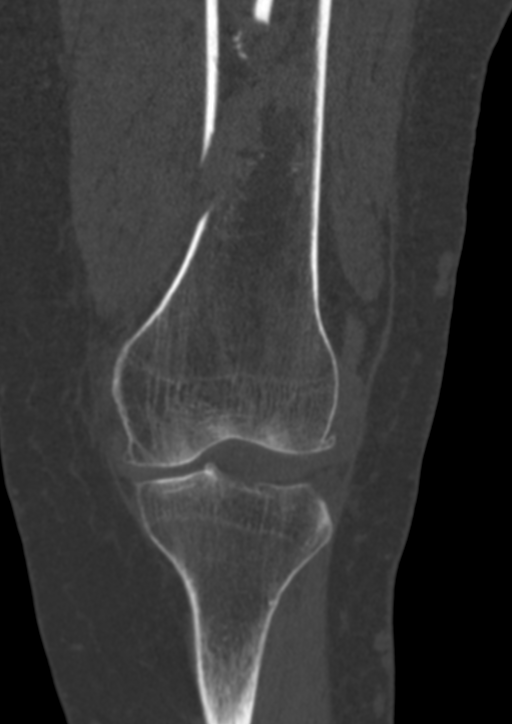
[im 63/105  bone]
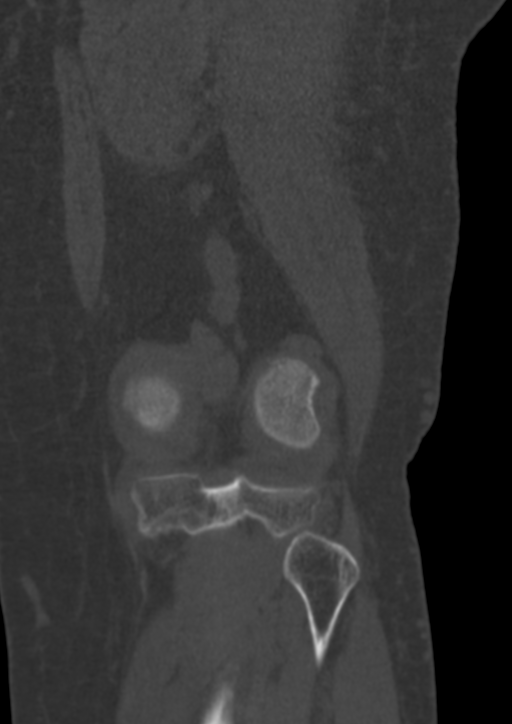

[Series 10: lower ext sag st · sagittal · 0.35mm/px · 5 of 72 slices shown, 6 images]
[im 24/72  bone]
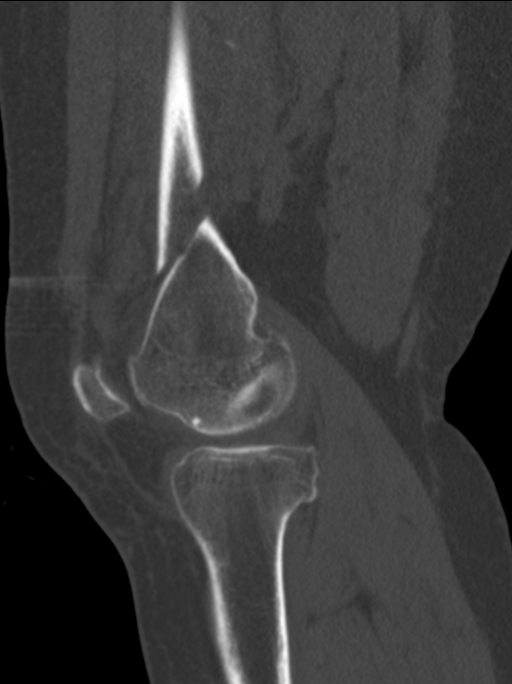
[im 30/72  bone]
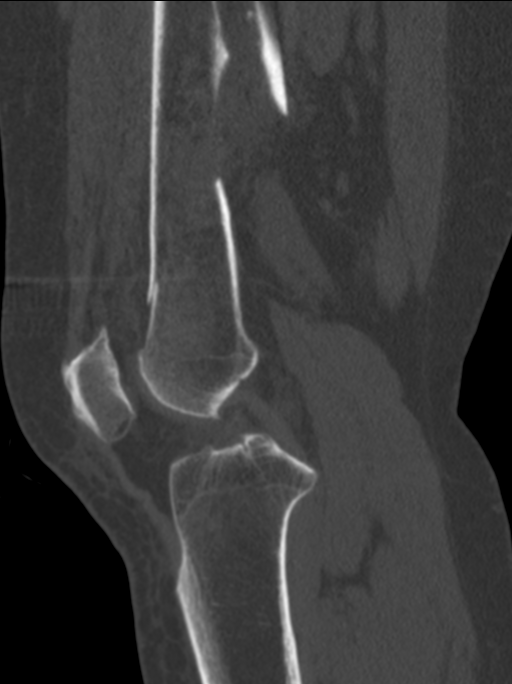
[im 36/72  soft-tissue]
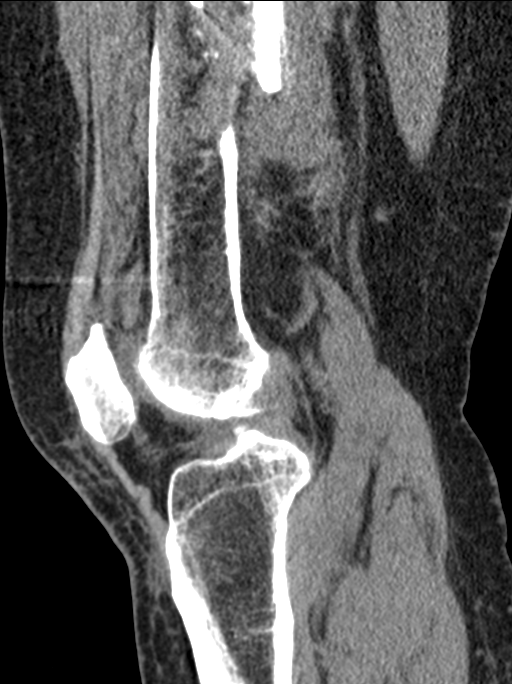
[im 36/72  bone]
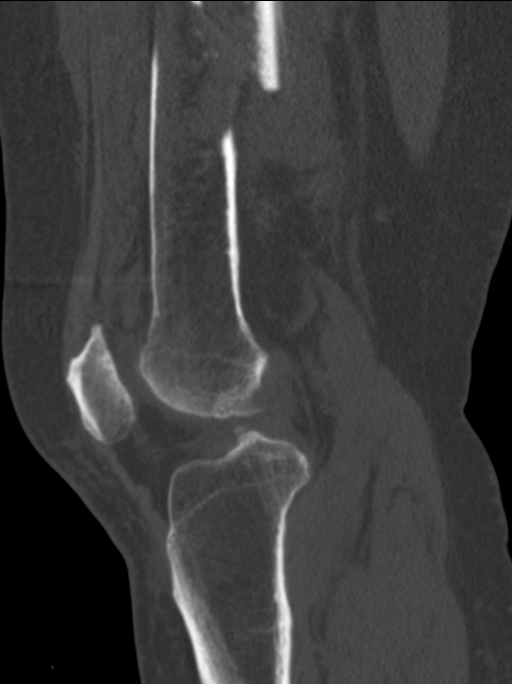
[im 42/72  bone]
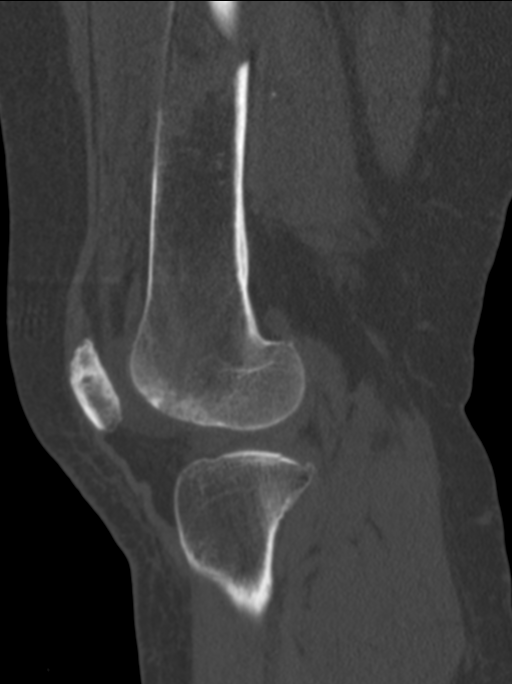
[im 48/72  bone]
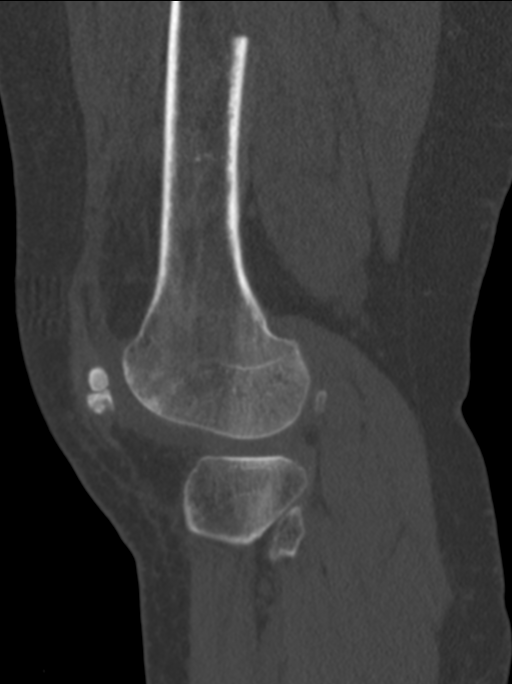

[13 of 33 positions shown; findings below may reference images not displayed]

FINDINGS: Bones/Joint/Cartilage

An acute, partly included closed, comminuted spiral oblique fracture
of the distal left femoral diaphysis is identified with 1 shaft
width anterior and lateral displacement of the main distal fracture
fragment which includes the femoral condyles. The more cephalad
margin of a fracture is not entirely included on this study. No
intra-articular extension. Trace suprapatellar joint effusion on the
axial images. The anterior margin of the fracture terminates just
above the metadiaphysis.

No knee joint dislocation. There is osteoarthritic joint space
narrowing of the femorotibial compartment with spurring.

Ligaments

Suboptimally assessed by CT.

Muscles and Tendons

No intramuscular hemorrhage.

Soft tissues

The femoral and popliteal arteries appear to be spared osseous
involvement. Mild soft tissue induration about the comminuted
fractures noted however. No focal soft tissue hematoma.
IMPRESSION: 1. An acute, closed, comminuted spiral oblique fracture of the
distal left femoral diaphysis is identified with 1 shaft width
anterior and lateral displacement of the main distal fracture
fragment which includes the femoral condyles.
2. No intra-articular extension of the fracture. Trace joint
effusion.

## 2018-08-29 ENCOUNTER — Telehealth (INDEPENDENT_AMBULATORY_CARE_PROVIDER_SITE_OTHER): Payer: Self-pay

## 2018-08-29 NOTE — Telephone Encounter (Signed)
Per Dr. Roda Shutters okay to do handicap form. Called patient no answer- phone not working. Called daugter [place] to return call to advise. Handicap form ready for pick up at the front desk.   Will try to call again.

## 2018-08-29 NOTE — Telephone Encounter (Signed)
Called again no answer. Patients phone is not in service and  Daughter did not answer

## 2018-08-30 NOTE — Telephone Encounter (Signed)
CALLED DAUGHTER NO ANSWER. PATIENT PHONE NUMBER STILL NOT WORKING. HANDICAP FORM READY FOR PICK UP AT THE FRONT DESK.

## 2019-02-18 ENCOUNTER — Telehealth: Payer: Self-pay | Admitting: Orthopaedic Surgery

## 2019-02-18 NOTE — Telephone Encounter (Signed)
Ok to renew?  

## 2019-02-18 NOTE — Telephone Encounter (Signed)
Pt called in requesting to get her handicap sticker renewed it expires in September.   [phone]

## 2019-02-19 NOTE — Telephone Encounter (Signed)
Can you please call them and let them know can be picked up at front door.

## 2019-02-19 NOTE — Telephone Encounter (Signed)
40mo temp

## 2019-02-19 NOTE — Telephone Encounter (Signed)
Pt aware.
